# Patient Record
Sex: Male | Born: 1935 | Race: White | Hispanic: No
Health system: Southern US, Community
[De-identification: ages and names within clinical notes are randomized; demographics above are authoritative.]

---

## 2006-02-08 ENCOUNTER — Ambulatory Visit: Payer: Self-pay | Admitting: Surgery

## 2006-02-14 ENCOUNTER — Ambulatory Visit: Payer: Self-pay | Admitting: Surgery

## 2006-08-08 ENCOUNTER — Ambulatory Visit: Payer: Self-pay | Admitting: Ophthalmology

## 2006-08-12 ENCOUNTER — Ambulatory Visit: Payer: Self-pay | Admitting: Ophthalmology

## 2006-11-14 ENCOUNTER — Inpatient Hospital Stay: Payer: Self-pay | Admitting: Vascular Surgery

## 2006-11-14 ENCOUNTER — Other Ambulatory Visit: Payer: Self-pay

## 2006-11-22 ENCOUNTER — Ambulatory Visit: Payer: Self-pay | Admitting: Surgery

## 2006-11-28 ENCOUNTER — Ambulatory Visit: Payer: Self-pay | Admitting: Surgery

## 2007-03-19 ENCOUNTER — Ambulatory Visit: Payer: Self-pay | Admitting: Surgery

## 2007-04-23 ENCOUNTER — Ambulatory Visit: Payer: Self-pay | Admitting: Unknown Physician Specialty

## 2007-04-24 ENCOUNTER — Ambulatory Visit: Payer: Self-pay | Admitting: Unknown Physician Specialty

## 2007-05-15 ENCOUNTER — Ambulatory Visit: Payer: Self-pay | Admitting: Unknown Physician Specialty

## 2007-06-08 ENCOUNTER — Ambulatory Visit: Payer: Self-pay | Admitting: Oncology

## 2007-07-01 ENCOUNTER — Ambulatory Visit: Payer: Self-pay | Admitting: Radiation Oncology

## 2007-07-06 ENCOUNTER — Ambulatory Visit: Payer: Self-pay | Admitting: Radiation Oncology

## 2007-07-07 ENCOUNTER — Ambulatory Visit: Payer: Self-pay | Admitting: Radiation Oncology

## 2007-07-09 ENCOUNTER — Ambulatory Visit: Payer: Self-pay | Admitting: Oncology

## 2007-07-15 ENCOUNTER — Ambulatory Visit: Payer: Self-pay | Admitting: Surgery

## 2007-07-29 ENCOUNTER — Other Ambulatory Visit: Payer: Self-pay

## 2007-07-30 ENCOUNTER — Other Ambulatory Visit: Payer: Self-pay

## 2007-07-30 ENCOUNTER — Inpatient Hospital Stay: Payer: Self-pay | Admitting: Internal Medicine

## 2007-08-06 ENCOUNTER — Ambulatory Visit: Payer: Self-pay | Admitting: Radiation Oncology

## 2007-09-05 ENCOUNTER — Ambulatory Visit: Payer: Self-pay | Admitting: Radiation Oncology

## 2007-10-06 ENCOUNTER — Ambulatory Visit: Payer: Self-pay | Admitting: Radiation Oncology

## 2007-11-05 ENCOUNTER — Ambulatory Visit: Payer: Self-pay | Admitting: Radiation Oncology

## 2007-11-19 ENCOUNTER — Ambulatory Visit: Payer: Self-pay | Admitting: Radiation Oncology

## 2007-12-05 ENCOUNTER — Ambulatory Visit: Payer: Self-pay | Admitting: Oncology

## 2007-12-06 ENCOUNTER — Ambulatory Visit: Payer: Self-pay | Admitting: Radiation Oncology

## 2007-12-10 ENCOUNTER — Ambulatory Visit: Payer: Self-pay | Admitting: Unknown Physician Specialty

## 2007-12-17 ENCOUNTER — Ambulatory Visit: Payer: Self-pay | Admitting: Radiation Oncology

## 2008-01-06 ENCOUNTER — Ambulatory Visit: Payer: Self-pay | Admitting: Radiation Oncology

## 2008-02-05 ENCOUNTER — Ambulatory Visit: Payer: Self-pay | Admitting: Radiation Oncology

## 2008-03-07 ENCOUNTER — Ambulatory Visit: Payer: Self-pay | Admitting: Oncology

## 2008-03-11 ENCOUNTER — Ambulatory Visit: Payer: Self-pay | Admitting: Oncology

## 2008-04-05 ENCOUNTER — Ambulatory Visit: Payer: Self-pay | Admitting: Internal Medicine

## 2008-04-06 ENCOUNTER — Ambulatory Visit: Payer: Self-pay | Admitting: Oncology

## 2008-04-13 ENCOUNTER — Ambulatory Visit: Payer: Self-pay | Admitting: Internal Medicine

## 2008-06-07 ENCOUNTER — Ambulatory Visit: Payer: Self-pay | Admitting: Oncology

## 2008-06-08 ENCOUNTER — Ambulatory Visit: Payer: Self-pay | Admitting: Oncology

## 2008-06-17 ENCOUNTER — Ambulatory Visit: Payer: Self-pay | Admitting: Oncology

## 2008-07-05 ENCOUNTER — Ambulatory Visit: Payer: Self-pay | Admitting: Oncology

## 2008-08-05 ENCOUNTER — Ambulatory Visit: Payer: Self-pay | Admitting: Oncology

## 2008-08-31 ENCOUNTER — Ambulatory Visit: Payer: Self-pay | Admitting: Unknown Physician Specialty

## 2008-09-04 ENCOUNTER — Ambulatory Visit: Payer: Self-pay | Admitting: Oncology

## 2008-09-16 ENCOUNTER — Ambulatory Visit: Payer: Self-pay | Admitting: Oncology

## 2008-10-05 ENCOUNTER — Ambulatory Visit: Payer: Self-pay | Admitting: Oncology

## 2008-11-11 ENCOUNTER — Ambulatory Visit: Payer: Self-pay | Admitting: Ophthalmology

## 2008-11-11 ENCOUNTER — Ambulatory Visit: Payer: Self-pay | Admitting: Cardiovascular Disease

## 2008-11-23 ENCOUNTER — Ambulatory Visit: Payer: Self-pay | Admitting: Ophthalmology

## 2008-12-05 ENCOUNTER — Ambulatory Visit: Payer: Self-pay | Admitting: Oncology

## 2008-12-16 ENCOUNTER — Ambulatory Visit: Payer: Self-pay | Admitting: Oncology

## 2009-01-05 ENCOUNTER — Ambulatory Visit: Payer: Self-pay | Admitting: Oncology

## 2009-02-04 ENCOUNTER — Ambulatory Visit: Payer: Self-pay | Admitting: Oncology

## 2009-06-07 ENCOUNTER — Ambulatory Visit: Payer: Self-pay | Admitting: Oncology

## 2009-06-16 ENCOUNTER — Ambulatory Visit: Payer: Self-pay | Admitting: Oncology

## 2009-07-05 ENCOUNTER — Ambulatory Visit: Payer: Self-pay | Admitting: Oncology

## 2009-09-04 ENCOUNTER — Ambulatory Visit: Payer: Self-pay | Admitting: Oncology

## 2009-09-19 ENCOUNTER — Ambulatory Visit: Payer: Self-pay | Admitting: Unknown Physician Specialty

## 2009-09-21 ENCOUNTER — Ambulatory Visit: Payer: Self-pay | Admitting: Oncology

## 2009-09-23 ENCOUNTER — Ambulatory Visit: Payer: Self-pay | Admitting: Oncology

## 2009-10-05 ENCOUNTER — Ambulatory Visit: Payer: Self-pay | Admitting: Oncology

## 2009-11-04 ENCOUNTER — Ambulatory Visit: Payer: Self-pay | Admitting: Oncology

## 2009-12-05 ENCOUNTER — Ambulatory Visit: Payer: Self-pay | Admitting: Oncology

## 2009-12-15 ENCOUNTER — Ambulatory Visit: Payer: Self-pay | Admitting: Oncology

## 2009-12-23 ENCOUNTER — Ambulatory Visit: Payer: Self-pay | Admitting: Oncology

## 2010-01-05 ENCOUNTER — Ambulatory Visit: Payer: Self-pay | Admitting: Oncology

## 2010-02-20 ENCOUNTER — Ambulatory Visit: Payer: Self-pay | Admitting: Unknown Physician Specialty

## 2010-02-21 LAB — PATHOLOGY REPORT

## 2010-03-06 ENCOUNTER — Ambulatory Visit: Payer: Self-pay | Admitting: Oncology

## 2010-03-07 ENCOUNTER — Ambulatory Visit: Payer: Self-pay | Admitting: Oncology

## 2010-03-09 ENCOUNTER — Ambulatory Visit: Payer: Self-pay | Admitting: Oncology

## 2010-03-10 ENCOUNTER — Ambulatory Visit: Payer: Self-pay | Admitting: Oncology

## 2010-04-06 ENCOUNTER — Ambulatory Visit: Payer: Self-pay | Admitting: Oncology

## 2010-05-07 ENCOUNTER — Ambulatory Visit: Payer: Self-pay | Admitting: Oncology

## 2010-06-07 ENCOUNTER — Ambulatory Visit: Payer: Self-pay | Admitting: Oncology

## 2010-06-09 ENCOUNTER — Inpatient Hospital Stay: Payer: Self-pay | Admitting: Internal Medicine

## 2010-06-14 ENCOUNTER — Encounter: Payer: Self-pay | Admitting: Internal Medicine

## 2010-06-14 DIAGNOSIS — K639 Disease of intestine, unspecified: Secondary | ICD-10-CM

## 2010-06-15 DIAGNOSIS — J449 Chronic obstructive pulmonary disease, unspecified: Secondary | ICD-10-CM

## 2010-06-15 DIAGNOSIS — I2510 Atherosclerotic heart disease of native coronary artery without angina pectoris: Secondary | ICD-10-CM

## 2010-06-15 DIAGNOSIS — E119 Type 2 diabetes mellitus without complications: Secondary | ICD-10-CM

## 2010-06-15 DIAGNOSIS — J4489 Other specified chronic obstructive pulmonary disease: Secondary | ICD-10-CM

## 2010-06-15 DIAGNOSIS — C159 Malignant neoplasm of esophagus, unspecified: Secondary | ICD-10-CM

## 2010-06-22 NOTE — Letter (Signed)
Summary: [facility] Face Sheet  [facility] Face Sheet   Imported By: Beau Fanny 06/15/2010 15:00:32  _____________________________________________________________________  External Attachment:    Type:   Image     Comment:   External Document

## 2010-06-28 DIAGNOSIS — I509 Heart failure, unspecified: Secondary | ICD-10-CM

## 2010-06-28 DIAGNOSIS — F4321 Adjustment disorder with depressed mood: Secondary | ICD-10-CM

## 2010-07-05 ENCOUNTER — Ambulatory Visit: Payer: Self-pay | Admitting: Oncology

## 2010-07-06 DIAGNOSIS — I2510 Atherosclerotic heart disease of native coronary artery without angina pectoris: Secondary | ICD-10-CM

## 2010-07-06 DIAGNOSIS — I10 Essential (primary) hypertension: Secondary | ICD-10-CM

## 2010-07-06 DIAGNOSIS — E119 Type 2 diabetes mellitus without complications: Secondary | ICD-10-CM

## 2010-07-06 DIAGNOSIS — J449 Chronic obstructive pulmonary disease, unspecified: Secondary | ICD-10-CM

## 2010-08-04 ENCOUNTER — Ambulatory Visit: Payer: Self-pay | Admitting: Physician Assistant

## 2010-09-11 ENCOUNTER — Inpatient Hospital Stay: Payer: Self-pay | Admitting: Internal Medicine

## 2010-09-25 ENCOUNTER — Ambulatory Visit: Payer: Self-pay | Admitting: Internal Medicine

## 2010-11-15 ENCOUNTER — Ambulatory Visit: Payer: Self-pay | Admitting: Internal Medicine

## 2010-11-28 ENCOUNTER — Ambulatory Visit: Payer: Self-pay | Admitting: Oncology

## 2010-12-06 ENCOUNTER — Ambulatory Visit: Payer: Self-pay | Admitting: Oncology

## 2010-12-08 ENCOUNTER — Ambulatory Visit: Payer: Self-pay | Admitting: Unknown Physician Specialty

## 2010-12-11 LAB — PATHOLOGY REPORT

## 2010-12-17 ENCOUNTER — Observation Stay: Payer: Self-pay | Admitting: Internal Medicine

## 2010-12-19 ENCOUNTER — Encounter: Payer: Self-pay | Admitting: Internal Medicine

## 2011-01-06 ENCOUNTER — Ambulatory Visit: Payer: Self-pay | Admitting: Oncology

## 2011-01-06 ENCOUNTER — Encounter: Payer: Self-pay | Admitting: Internal Medicine

## 2011-01-18 ENCOUNTER — Ambulatory Visit: Payer: Self-pay | Admitting: Surgery

## 2011-02-05 ENCOUNTER — Ambulatory Visit: Payer: Self-pay | Admitting: Oncology

## 2011-02-05 ENCOUNTER — Encounter: Payer: Self-pay | Admitting: Internal Medicine

## 2011-03-08 ENCOUNTER — Ambulatory Visit: Payer: Self-pay | Admitting: Oncology

## 2011-03-08 ENCOUNTER — Encounter: Payer: Self-pay | Admitting: Internal Medicine

## 2011-04-07 ENCOUNTER — Ambulatory Visit: Payer: Self-pay | Admitting: Oncology

## 2011-04-07 ENCOUNTER — Encounter: Payer: Self-pay | Admitting: Internal Medicine

## 2011-04-25 ENCOUNTER — Ambulatory Visit: Payer: Self-pay | Admitting: Oncology

## 2011-05-08 ENCOUNTER — Ambulatory Visit: Payer: Self-pay | Admitting: Oncology

## 2011-05-08 ENCOUNTER — Encounter: Payer: Self-pay | Admitting: Internal Medicine

## 2011-06-08 ENCOUNTER — Ambulatory Visit: Payer: Self-pay | Admitting: Oncology

## 2011-06-08 ENCOUNTER — Encounter: Payer: Self-pay | Admitting: Internal Medicine

## 2011-06-25 ENCOUNTER — Emergency Department: Payer: Self-pay | Admitting: Emergency Medicine

## 2011-07-06 ENCOUNTER — Ambulatory Visit: Payer: Self-pay | Admitting: Oncology

## 2011-07-19 ENCOUNTER — Ambulatory Visit: Payer: Self-pay | Admitting: Oncology

## 2011-07-20 LAB — CBC CANCER CENTER
Basophil %: 0 %
Eosinophil #: 0.3 x10 3/mm (ref 0.0–0.7)
Eosinophil %: 3.9 %
Lymphocyte #: 1.7 x10 3/mm (ref 1.0–3.6)
MCH: 20.8 pg — ABNORMAL LOW (ref 26.0–34.0)
MCHC: 30.7 g/dL — ABNORMAL LOW (ref 32.0–36.0)
Monocyte #: 0.9 x10 3/mm — ABNORMAL HIGH (ref 0.0–0.7)
Neutrophil %: 64.3 %
RBC: 3.88 10*6/uL — ABNORMAL LOW (ref 4.40–5.90)
RDW: 22.7 % — ABNORMAL HIGH (ref 11.5–14.5)
WBC: 8.4 x10 3/mm (ref 3.8–10.6)

## 2011-07-20 LAB — COMPREHENSIVE METABOLIC PANEL
Albumin: 2.9 g/dL — ABNORMAL LOW (ref 3.4–5.0)
Anion Gap: 7 (ref 7–16)
BUN: 28 mg/dL — ABNORMAL HIGH (ref 7–18)
Bilirubin,Total: 0.3 mg/dL (ref 0.2–1.0)
Calcium, Total: 8.5 mg/dL (ref 8.5–10.1)
Chloride: 111 mmol/L — ABNORMAL HIGH (ref 98–107)
EGFR (African American): 47 — ABNORMAL LOW
Glucose: 118 mg/dL — ABNORMAL HIGH (ref 65–99)
SGOT(AST): 15 U/L (ref 15–37)
SGPT (ALT): 18 U/L
Sodium: 144 mmol/L (ref 136–145)
Total Protein: 6.7 g/dL (ref 6.4–8.2)

## 2011-08-06 ENCOUNTER — Ambulatory Visit: Payer: Self-pay | Admitting: Oncology

## 2011-08-17 ENCOUNTER — Ambulatory Visit: Payer: Self-pay | Admitting: Unknown Physician Specialty

## 2011-08-20 LAB — PATHOLOGY REPORT

## 2011-09-20 ENCOUNTER — Inpatient Hospital Stay: Payer: Self-pay | Admitting: Internal Medicine

## 2011-09-20 ENCOUNTER — Ambulatory Visit: Payer: Self-pay | Admitting: Oncology

## 2011-09-20 LAB — COMPREHENSIVE METABOLIC PANEL
Albumin: 3 g/dL — ABNORMAL LOW (ref 3.4–5.0)
Alkaline Phosphatase: 94 U/L (ref 50–136)
Alkaline Phosphatase: 94 U/L (ref 50–136)
Anion Gap: 9 (ref 7–16)
Anion Gap: 5 — ABNORMAL LOW (ref 7–16)
BUN: 13 mg/dL (ref 7–18)
Bilirubin,Total: 0.5 mg/dL (ref 0.2–1.0)
Calcium, Total: 8.4 mg/dL — ABNORMAL LOW (ref 8.5–10.1)
Calcium, Total: 8.4 mg/dL — ABNORMAL LOW (ref 8.5–10.1)
Chloride: 108 mmol/L — ABNORMAL HIGH (ref 98–107)
Co2: 28 mmol/L (ref 21–32)
Creatinine: 1.38 mg/dL — ABNORMAL HIGH (ref 0.60–1.30)
Creatinine: 1.3 mg/dL (ref 0.60–1.30)
EGFR (African American): 54 — ABNORMAL LOW
EGFR (African American): 58 — ABNORMAL LOW
EGFR (Non-African Amer.): 47 — ABNORMAL LOW
EGFR (Non-African Amer.): 50 — ABNORMAL LOW
Glucose: 124 mg/dL — ABNORMAL HIGH (ref 65–99)
Glucose: 155 mg/dL — ABNORMAL HIGH (ref 65–99)
Osmolality: 289 (ref 275–301)
Osmolality: 285 (ref 275–301)
Potassium: 4.2 mmol/L (ref 3.5–5.1)
SGOT(AST): 19 U/L (ref 15–37)
SGOT(AST): 25 U/L (ref 15–37)
SGPT (ALT): 19 U/L
SGPT (ALT): 17 U/L
Sodium: 144 mmol/L (ref 136–145)
Sodium: 141 mmol/L (ref 136–145)
Total Protein: 6.7 g/dL (ref 6.4–8.2)
Total Protein: 7 g/dL (ref 6.4–8.2)

## 2011-09-20 LAB — CBC CANCER CENTER
Eosinophil #: 0.5 x10 3/mm (ref 0.0–0.7)
Eosinophil %: 5.4 %
HCT: 38.1 % — ABNORMAL LOW (ref 40.0–52.0)
HGB: 11.9 g/dL — ABNORMAL LOW (ref 13.0–18.0)
Lymphocyte %: 14.5 %
MCH: 25.3 pg — ABNORMAL LOW (ref 26.0–34.0)
MCV: 81 fL (ref 80–100)
Monocyte #: 0.9 x10 3/mm (ref 0.2–1.0)
Platelet: 183 x10 3/mm (ref 150–440)
RBC: 4.7 10*6/uL (ref 4.40–5.90)

## 2011-09-20 LAB — CBC
HCT: 37.9 % — ABNORMAL LOW (ref 40.0–52.0)
HGB: 12.1 g/dL — ABNORMAL LOW (ref 13.0–18.0)
MCH: 25.6 pg — ABNORMAL LOW (ref 26.0–34.0)
MCHC: 31.8 g/dL — ABNORMAL LOW (ref 32.0–36.0)
MCV: 81 fL (ref 80–100)
Platelet: 184 10*3/uL (ref 150–440)
RBC: 4.71 10*6/uL (ref 4.40–5.90)
RDW: 30.1 % — ABNORMAL HIGH (ref 11.5–14.5)
WBC: 8.1 10*3/uL (ref 3.8–10.6)

## 2011-09-20 LAB — PROTIME-INR
INR: 0.9
Prothrombin Time: 12.5 secs (ref 11.5–14.7)

## 2011-09-20 LAB — URINALYSIS, COMPLETE
Bilirubin,UR: NEGATIVE
Blood: NEGATIVE
Glucose,UR: NEGATIVE mg/dL (ref 0–75)
Ketone: NEGATIVE
Nitrite: NEGATIVE
Ph: 6 (ref 4.5–8.0)
Protein: NEGATIVE
RBC,UR: 1 /HPF (ref 0–5)
Specific Gravity: 1.006 (ref 1.003–1.030)
Squamous Epithelial: NONE SEEN
WBC UR: 4 /HPF (ref 0–5)

## 2011-09-21 LAB — CBC WITH DIFFERENTIAL/PLATELET
Basophil #: 0 10*3/uL (ref 0.0–0.1)
Eosinophil #: 0 10*3/uL (ref 0.0–0.7)
Lymphocyte %: 5.7 %
MCH: 25.1 pg — ABNORMAL LOW (ref 26.0–34.0)
MCHC: 31.4 g/dL — ABNORMAL LOW (ref 32.0–36.0)
MCV: 80 fL (ref 80–100)
Neutrophil #: 10.8 10*3/uL — ABNORMAL HIGH (ref 1.4–6.5)
Neutrophil %: 90.8 %
Platelet: 164 10*3/uL (ref 150–440)
RBC: 4.46 10*6/uL (ref 4.40–5.90)
RDW: 29.4 % — ABNORMAL HIGH (ref 11.5–14.5)
WBC: 11.9 10*3/uL — ABNORMAL HIGH (ref 3.8–10.6)

## 2011-09-21 LAB — BASIC METABOLIC PANEL
Anion Gap: 8 (ref 7–16)
BUN: 17 mg/dL (ref 7–18)
Chloride: 110 mmol/L — ABNORMAL HIGH (ref 98–107)
Co2: 26 mmol/L (ref 21–32)
Creatinine: 1.26 mg/dL (ref 0.60–1.30)
EGFR (Non-African Amer.): 52 — ABNORMAL LOW
Glucose: 171 mg/dL — ABNORMAL HIGH (ref 65–99)
Osmolality: 292 (ref 275–301)
Sodium: 144 mmol/L (ref 136–145)

## 2011-09-22 LAB — BASIC METABOLIC PANEL
BUN: 20 mg/dL — ABNORMAL HIGH (ref 7–18)
Calcium, Total: 7.8 mg/dL — ABNORMAL LOW (ref 8.5–10.1)
Co2: 26 mmol/L (ref 21–32)
Creatinine: 1.27 mg/dL (ref 0.60–1.30)
EGFR (African American): >60
EGFR (Non-African Amer.): 52 — ABNORMAL LOW
Glucose: 113 mg/dL — ABNORMAL HIGH (ref 65–99)
Osmolality: 288 (ref 275–301)
Potassium: 4.2 mmol/L (ref 3.5–5.1)
Sodium: 143 mmol/L (ref 136–145)

## 2011-09-22 LAB — CBC WITH DIFFERENTIAL/PLATELET
Basophil %: 0.1 %
Eosinophil #: 0 10*3/uL (ref 0.0–0.7)
Eosinophil %: 0 %
HCT: 32 % — ABNORMAL LOW (ref 40.0–52.0)
HGB: 10.1 g/dL — ABNORMAL LOW (ref 13.0–18.0)
Lymphocyte #: 0.9 10*3/uL — ABNORMAL LOW (ref 1.0–3.6)
MCH: 25.3 pg — ABNORMAL LOW (ref 26.0–34.0)
Monocyte #: 0.5 x10 3/mm (ref 0.2–1.0)
Monocyte %: 3.8 %
Neutrophil %: 89.9 %
RDW: 29.4 % — ABNORMAL HIGH (ref 11.5–14.5)
WBC: 13.8 10*3/uL — ABNORMAL HIGH (ref 3.8–10.6)

## 2011-09-23 LAB — BASIC METABOLIC PANEL
Anion Gap: 7 (ref 7–16)
BUN: 18 mg/dL (ref 7–18)
Calcium, Total: 8 mg/dL — ABNORMAL LOW (ref 8.5–10.1)
Creatinine: 1.22 mg/dL (ref 0.60–1.30)
EGFR (Non-African Amer.): 54 — ABNORMAL LOW
Osmolality: 289 (ref 275–301)
Potassium: 4.3 mmol/L (ref 3.5–5.1)
Sodium: 144 mmol/L (ref 136–145)

## 2011-09-23 LAB — CBC WITH DIFFERENTIAL/PLATELET
Eosinophil #: 0.1 10*3/uL (ref 0.0–0.7)
Lymphocyte #: 1.2 10*3/uL (ref 1.0–3.6)
Lymphocyte %: 14.7 %
MCH: 25.5 pg — ABNORMAL LOW (ref 26.0–34.0)
MCV: 80 fL (ref 80–100)
Monocyte #: 0.3 x10 3/mm (ref 0.2–1.0)
RBC: 3.94 10*6/uL — ABNORMAL LOW (ref 4.40–5.90)
RDW: 29.3 % — ABNORMAL HIGH (ref 11.5–14.5)

## 2011-09-24 ENCOUNTER — Encounter: Payer: Self-pay | Admitting: Internal Medicine

## 2011-10-04 LAB — BASIC METABOLIC PANEL
Anion Gap: 9 (ref 7–16)
BUN: 27 mg/dL — ABNORMAL HIGH (ref 7–18)
Calcium, Total: 8.8 mg/dL (ref 8.5–10.1)
Chloride: 109 mmol/L — ABNORMAL HIGH (ref 98–107)
Co2: 25 mmol/L (ref 21–32)
EGFR (African American): 47 — ABNORMAL LOW
EGFR (Non-African Amer.): 40 — ABNORMAL LOW
Glucose: 106 mg/dL — ABNORMAL HIGH (ref 65–99)

## 2011-10-04 LAB — CBC WITH DIFFERENTIAL/PLATELET
Basophil %: 1.7 %
Eosinophil #: 0.5 10*3/uL (ref 0.0–0.7)
HGB: 10.1 g/dL — ABNORMAL LOW (ref 13.0–18.0)
Lymphocyte #: 1.7 10*3/uL (ref 1.0–3.6)
MCHC: 30.9 g/dL — ABNORMAL LOW (ref 32.0–36.0)
Monocyte #: 1.2 x10 3/mm — ABNORMAL HIGH (ref 0.2–1.0)
Monocyte %: 13.9 %
Neutrophil #: 4.8 10*3/uL (ref 1.4–6.5)
Platelet: 335 10*3/uL (ref 150–440)
RDW: 28.3 % — ABNORMAL HIGH (ref 11.5–14.5)
WBC: 8.3 10*3/uL (ref 3.8–10.6)

## 2011-10-06 ENCOUNTER — Ambulatory Visit: Payer: Self-pay | Admitting: Oncology

## 2011-10-06 ENCOUNTER — Encounter: Payer: Self-pay | Admitting: Internal Medicine

## 2011-10-18 LAB — COMPREHENSIVE METABOLIC PANEL
Albumin: 2.6 g/dL — ABNORMAL LOW (ref 3.4–5.0)
Anion Gap: 11 (ref 7–16)
BUN: 40 mg/dL — ABNORMAL HIGH (ref 7–18)
Bilirubin,Total: 0.4 mg/dL (ref 0.2–1.0)
Calcium, Total: 8.3 mg/dL — ABNORMAL LOW (ref 8.5–10.1)
Chloride: 104 mmol/L (ref 98–107)
Co2: 24 mmol/L (ref 21–32)
Creatinine: 1.69 mg/dL — ABNORMAL HIGH (ref 0.60–1.30)
EGFR (African American): 43 — ABNORMAL LOW
EGFR (Non-African Amer.): 37 — ABNORMAL LOW
Glucose: 211 mg/dL — ABNORMAL HIGH (ref 65–99)
Osmolality: 294 (ref 275–301)
Potassium: 4.2 mmol/L (ref 3.5–5.1)
SGOT(AST): 19 U/L (ref 15–37)
SGPT (ALT): 44 U/L

## 2011-10-18 LAB — CBC CANCER CENTER
Eosinophil #: 0.3 x10 3/mm (ref 0.0–0.7)
HGB: 11.1 g/dL — ABNORMAL LOW (ref 13.0–18.0)
Lymphocyte #: 1.4 x10 3/mm (ref 1.0–3.6)
Lymphocyte %: 11.4 %
MCH: 26.7 pg (ref 26.0–34.0)
MCV: 87 fL (ref 80–100)
Neutrophil %: 73.8 %
RDW: 25.7 % — ABNORMAL HIGH (ref 11.5–14.5)
WBC: 12.2 x10 3/mm — ABNORMAL HIGH (ref 3.8–10.6)

## 2011-10-18 LAB — FERRITIN: Ferritin (ARMC): 55 ng/mL (ref 8–388)

## 2011-10-18 LAB — IRON AND TIBC
Iron: 63 ug/dL — ABNORMAL LOW (ref 65–175)
Unbound Iron-Bind.Cap.: 232 ug/dL

## 2011-10-25 LAB — CBC CANCER CENTER
Basophil #: 0.1 x10 3/mm (ref 0.0–0.1)
Eosinophil #: 0.3 x10 3/mm (ref 0.0–0.7)
HCT: 36.7 % — ABNORMAL LOW (ref 40.0–52.0)
HGB: 11.4 g/dL — ABNORMAL LOW (ref 13.0–18.0)
Lymphocyte #: 0.9 x10 3/mm — ABNORMAL LOW (ref 1.0–3.6)
Lymphocyte %: 10.8 %
MCH: 27.1 pg (ref 26.0–34.0)
MCHC: 31.1 g/dL — ABNORMAL LOW (ref 32.0–36.0)
MCV: 87 fL (ref 80–100)
Monocyte %: 6.1 %
Neutrophil #: 6.8 x10 3/mm — ABNORMAL HIGH (ref 1.4–6.5)
Neutrophil %: 78 %
RBC: 4.2 10*6/uL — ABNORMAL LOW (ref 4.40–5.90)
RDW: 22.8 % — ABNORMAL HIGH (ref 11.5–14.5)
WBC: 8.7 x10 3/mm (ref 3.8–10.6)

## 2011-10-25 LAB — COMPREHENSIVE METABOLIC PANEL
Alkaline Phosphatase: 123 U/L (ref 50–136)
Anion Gap: 9 (ref 7–16)
Calcium, Total: 8.6 mg/dL (ref 8.5–10.1)
Co2: 25 mmol/L (ref 21–32)
EGFR (African American): 48 — ABNORMAL LOW
Glucose: 161 mg/dL — ABNORMAL HIGH (ref 65–99)
Osmolality: 286 (ref 275–301)
Potassium: 4.4 mmol/L (ref 3.5–5.1)
Sodium: 139 mmol/L (ref 136–145)

## 2011-11-01 LAB — CBC CANCER CENTER
Basophil #: 0.1 x10 3/mm (ref 0.0–0.1)
Basophil %: 2 %
Eosinophil #: 0.2 x10 3/mm (ref 0.0–0.7)
Eosinophil %: 4.9 %
HGB: 10.4 g/dL — ABNORMAL LOW (ref 13.0–18.0)
Lymphocyte %: 19.8 %
MCH: 27.8 pg (ref 26.0–34.0)
MCHC: 31.5 g/dL — ABNORMAL LOW (ref 32.0–36.0)
MCV: 88 fL (ref 80–100)
Monocyte #: 0.5 x10 3/mm (ref 0.2–1.0)
Monocyte %: 9.7 %
Neutrophil #: 3.1 x10 3/mm (ref 1.4–6.5)
Neutrophil %: 63.6 %
Platelet: 220 x10 3/mm (ref 150–440)

## 2011-11-01 LAB — COMPREHENSIVE METABOLIC PANEL
Albumin: 2.6 g/dL — ABNORMAL LOW (ref 3.4–5.0)
Alkaline Phosphatase: 109 U/L (ref 50–136)
BUN: 30 mg/dL — ABNORMAL HIGH (ref 7–18)
Chloride: 109 mmol/L — ABNORMAL HIGH (ref 98–107)
Co2: 24 mmol/L (ref 21–32)
Creatinine: 1.63 mg/dL — ABNORMAL HIGH (ref 0.60–1.30)
Glucose: 180 mg/dL — ABNORMAL HIGH (ref 65–99)
SGPT (ALT): 26 U/L

## 2011-11-05 ENCOUNTER — Ambulatory Visit: Payer: Self-pay | Admitting: Oncology

## 2011-11-15 LAB — COMPREHENSIVE METABOLIC PANEL
Alkaline Phosphatase: 104 U/L (ref 50–136)
Anion Gap: 9 (ref 7–16)
Bilirubin,Total: 0.3 mg/dL (ref 0.2–1.0)
Calcium, Total: 8.5 mg/dL (ref 8.5–10.1)
Chloride: 107 mmol/L (ref 98–107)
Co2: 26 mmol/L (ref 21–32)
Creatinine: 1.69 mg/dL — ABNORMAL HIGH (ref 0.60–1.30)
EGFR (African American): 43 — ABNORMAL LOW
EGFR (Non-African Amer.): 37 — ABNORMAL LOW
Glucose: 156 mg/dL — ABNORMAL HIGH (ref 65–99)
Potassium: 4.2 mmol/L (ref 3.5–5.1)
SGOT(AST): 15 U/L (ref 15–37)
SGPT (ALT): 19 U/L
Sodium: 142 mmol/L (ref 136–145)

## 2011-11-15 LAB — CBC CANCER CENTER
Basophil %: 2.5 %
Eosinophil #: 0.2 x10 3/mm (ref 0.0–0.7)
HGB: 10.5 g/dL — ABNORMAL LOW (ref 13.0–18.0)
Lymphocyte #: 1.2 x10 3/mm (ref 1.0–3.6)
Lymphocyte %: 16.6 %
MCH: 27.6 pg (ref 26.0–34.0)
MCHC: 31.5 g/dL — ABNORMAL LOW (ref 32.0–36.0)
MCV: 87 fL (ref 80–100)
Monocyte #: 1.2 x10 3/mm — ABNORMAL HIGH (ref 0.2–1.0)
Neutrophil %: 60.8 %
Platelet: 287 x10 3/mm (ref 150–440)
RBC: 3.81 10*6/uL — ABNORMAL LOW (ref 4.40–5.90)
RDW: 20.3 % — ABNORMAL HIGH (ref 11.5–14.5)

## 2011-11-22 LAB — CBC CANCER CENTER
Basophil #: 0.1 x10 3/mm (ref 0.0–0.1)
Eosinophil %: 2.1 %
Lymphocyte #: 1.1 x10 3/mm (ref 1.0–3.6)
Lymphocyte %: 13.2 %
Monocyte %: 5.4 %
Neutrophil #: 6.7 x10 3/mm — ABNORMAL HIGH (ref 1.4–6.5)
Neutrophil %: 78 %
Platelet: 227 x10 3/mm (ref 150–440)
RBC: 3.88 10*6/uL — ABNORMAL LOW (ref 4.40–5.90)
RDW: 20.6 % — ABNORMAL HIGH (ref 11.5–14.5)
WBC: 8.5 x10 3/mm (ref 3.8–10.6)

## 2011-11-22 LAB — COMPREHENSIVE METABOLIC PANEL
Alkaline Phosphatase: 93 U/L (ref 50–136)
Anion Gap: 8 (ref 7–16)
BUN: 16 mg/dL (ref 7–18)
Bilirubin,Total: 0.4 mg/dL (ref 0.2–1.0)
Chloride: 107 mmol/L (ref 98–107)
Co2: 26 mmol/L (ref 21–32)
Creatinine: 1.32 mg/dL — ABNORMAL HIGH (ref 0.60–1.30)
EGFR (African American): 57 — ABNORMAL LOW
EGFR (Non-African Amer.): 50 — ABNORMAL LOW
Glucose: 154 mg/dL — ABNORMAL HIGH (ref 65–99)
Osmolality: 286 (ref 275–301)
Potassium: 4.2 mmol/L (ref 3.5–5.1)
Sodium: 141 mmol/L (ref 136–145)

## 2011-11-29 LAB — COMPREHENSIVE METABOLIC PANEL
Albumin: 2.7 g/dL — ABNORMAL LOW (ref 3.4–5.0)
Anion Gap: 7 (ref 7–16)
BUN: 19 mg/dL — ABNORMAL HIGH (ref 7–18)
Calcium, Total: 8.2 mg/dL — ABNORMAL LOW (ref 8.5–10.1)
Chloride: 108 mmol/L — ABNORMAL HIGH (ref 98–107)
Co2: 26 mmol/L (ref 21–32)
EGFR (African American): 53 — ABNORMAL LOW
Glucose: 127 mg/dL — ABNORMAL HIGH (ref 65–99)
Osmolality: 285 (ref 275–301)
Potassium: 4.3 mmol/L (ref 3.5–5.1)
SGPT (ALT): 21 U/L
Sodium: 141 mmol/L (ref 136–145)
Total Protein: 5.8 g/dL — ABNORMAL LOW (ref 6.4–8.2)

## 2011-11-29 LAB — CBC CANCER CENTER
Basophil #: 0.1 x10 3/mm (ref 0.0–0.1)
Basophil %: 1.3 %
Eosinophil #: 0.2 x10 3/mm (ref 0.0–0.7)
HCT: 32.9 % — ABNORMAL LOW (ref 40.0–52.0)
HGB: 10.6 g/dL — ABNORMAL LOW (ref 13.0–18.0)
Lymphocyte #: 1.1 x10 3/mm (ref 1.0–3.6)
Lymphocyte %: 19.6 %
MCHC: 32.4 g/dL (ref 32.0–36.0)
MCV: 89 fL (ref 80–100)
Neutrophil #: 3.8 x10 3/mm (ref 1.4–6.5)
Platelet: 203 x10 3/mm (ref 150–440)
RBC: 3.71 10*6/uL — ABNORMAL LOW (ref 4.40–5.90)
RDW: 21.4 % — ABNORMAL HIGH (ref 11.5–14.5)
WBC: 5.6 x10 3/mm (ref 3.8–10.6)

## 2011-12-04 ENCOUNTER — Encounter: Payer: Self-pay | Admitting: Orthopedic Surgery

## 2011-12-06 ENCOUNTER — Ambulatory Visit: Payer: Self-pay | Admitting: Oncology

## 2011-12-06 ENCOUNTER — Encounter: Payer: Self-pay | Admitting: Orthopedic Surgery

## 2011-12-13 LAB — CBC CANCER CENTER
Basophil %: 2.2 %
Eosinophil %: 2.7 %
HCT: 32.9 % — ABNORMAL LOW (ref 40.0–52.0)
HGB: 10.7 g/dL — ABNORMAL LOW (ref 13.0–18.0)
Lymphocyte %: 16.8 %
MCH: 28.5 pg (ref 26.0–34.0)
MCV: 87 fL (ref 80–100)
Monocyte %: 14.9 %
Neutrophil #: 4.6 x10 3/mm (ref 1.4–6.5)
Platelet: 234 x10 3/mm (ref 150–440)
RBC: 3.76 10*6/uL — ABNORMAL LOW (ref 4.40–5.90)
WBC: 7.3 x10 3/mm (ref 3.8–10.6)

## 2011-12-13 LAB — COMPREHENSIVE METABOLIC PANEL
Albumin: 2.7 g/dL — ABNORMAL LOW (ref 3.4–5.0)
Alkaline Phosphatase: 92 U/L (ref 50–136)
BUN: 23 mg/dL — ABNORMAL HIGH (ref 7–18)
Bilirubin,Total: 0.3 mg/dL (ref 0.2–1.0)
Co2: 27 mmol/L (ref 21–32)
Creatinine: 1.4 mg/dL — ABNORMAL HIGH (ref 0.60–1.30)
EGFR (African American): 53 — ABNORMAL LOW
EGFR (Non-African Amer.): 46 — ABNORMAL LOW
Glucose: 163 mg/dL — ABNORMAL HIGH (ref 65–99)
Osmolality: 292 (ref 275–301)
SGPT (ALT): 16 U/L (ref 12–78)
Sodium: 143 mmol/L (ref 136–145)

## 2011-12-20 LAB — CBC CANCER CENTER
Basophil %: 1.1 %
Eosinophil #: 0.3 x10 3/mm (ref 0.0–0.7)
Eosinophil %: 3 %
HGB: 9.9 g/dL — ABNORMAL LOW (ref 13.0–18.0)
Lymphocyte %: 13.7 %
MCH: 28 pg (ref 26.0–34.0)
MCHC: 31.7 g/dL — ABNORMAL LOW (ref 32.0–36.0)
MCV: 89 fL (ref 80–100)
Monocyte %: 6.5 %
Neutrophil %: 75.7 %
Platelet: 231 x10 3/mm (ref 150–440)
RBC: 3.54 10*6/uL — ABNORMAL LOW (ref 4.40–5.90)
WBC: 10.9 x10 3/mm — ABNORMAL HIGH (ref 3.8–10.6)

## 2011-12-20 LAB — COMPREHENSIVE METABOLIC PANEL
Albumin: 2.6 g/dL — ABNORMAL LOW (ref 3.4–5.0)
Anion Gap: 8 (ref 7–16)
Calcium, Total: 8.2 mg/dL — ABNORMAL LOW (ref 8.5–10.1)
Chloride: 106 mmol/L (ref 98–107)
Co2: 27 mmol/L (ref 21–32)
EGFR (African American): 43 — ABNORMAL LOW
EGFR (Non-African Amer.): 37 — ABNORMAL LOW
Glucose: 159 mg/dL — ABNORMAL HIGH (ref 65–99)
Osmolality: 290 (ref 275–301)
Potassium: 4.9 mmol/L (ref 3.5–5.1)
SGOT(AST): 17 U/L (ref 15–37)
Total Protein: 5.7 g/dL — ABNORMAL LOW (ref 6.4–8.2)

## 2011-12-27 LAB — COMPREHENSIVE METABOLIC PANEL
Albumin: 2.6 g/dL — ABNORMAL LOW (ref 3.4–5.0)
Alkaline Phosphatase: 89 U/L (ref 50–136)
Bilirubin,Total: 0.5 mg/dL (ref 0.2–1.0)
Calcium, Total: 8.1 mg/dL — ABNORMAL LOW (ref 8.5–10.1)
Creatinine: 1.41 mg/dL — ABNORMAL HIGH (ref 0.60–1.30)
Osmolality: 288 (ref 275–301)
Potassium: 4.4 mmol/L (ref 3.5–5.1)
SGOT(AST): 21 U/L (ref 15–37)
SGPT (ALT): 21 U/L (ref 12–78)
Sodium: 142 mmol/L (ref 136–145)

## 2011-12-27 LAB — CBC CANCER CENTER
Basophil %: 1.5 %
Eosinophil #: 0.2 x10 3/mm (ref 0.0–0.7)
HGB: 9.3 g/dL — ABNORMAL LOW (ref 13.0–18.0)
Lymphocyte #: 0.9 x10 3/mm — ABNORMAL LOW (ref 1.0–3.6)
Lymphocyte %: 14.9 %
MCH: 28 pg (ref 26.0–34.0)
MCHC: 31.4 g/dL — ABNORMAL LOW (ref 32.0–36.0)
MCV: 89 fL (ref 80–100)
Neutrophil #: 4.5 x10 3/mm (ref 1.4–6.5)
Neutrophil %: 72.7 %
RDW: 23.8 % — ABNORMAL HIGH (ref 11.5–14.5)

## 2011-12-27 LAB — PROTIME-INR
INR: 0.9
Prothrombin Time: 12.4 secs (ref 11.5–14.7)

## 2011-12-27 LAB — APTT: Activated PTT: 27.9 secs (ref 23.6–35.9)

## 2012-01-06 ENCOUNTER — Ambulatory Visit: Payer: Self-pay | Admitting: Oncology

## 2012-01-09 ENCOUNTER — Ambulatory Visit: Payer: Self-pay | Admitting: Internal Medicine

## 2012-02-09 ENCOUNTER — Emergency Department: Payer: Self-pay | Admitting: Emergency Medicine

## 2012-03-26 ENCOUNTER — Ambulatory Visit: Payer: Self-pay | Admitting: Oncology

## 2012-03-27 ENCOUNTER — Ambulatory Visit: Payer: Self-pay | Admitting: Oncology

## 2012-03-27 LAB — COMPREHENSIVE METABOLIC PANEL
Albumin: 2.8 g/dL — ABNORMAL LOW (ref 3.4–5.0)
Anion Gap: 9 (ref 7–16)
Bilirubin,Total: 0.3 mg/dL (ref 0.2–1.0)
Calcium, Total: 8.7 mg/dL (ref 8.5–10.1)
Chloride: 106 mmol/L (ref 98–107)
Co2: 27 mmol/L (ref 21–32)
Creatinine: 1.44 mg/dL — ABNORMAL HIGH (ref 0.60–1.30)
EGFR (African American): 52 — ABNORMAL LOW
Glucose: 158 mg/dL — ABNORMAL HIGH (ref 65–99)
Osmolality: 289 (ref 275–301)
Potassium: 4 mmol/L (ref 3.5–5.1)
Sodium: 142 mmol/L (ref 136–145)
Total Protein: 6.6 g/dL (ref 6.4–8.2)

## 2012-03-27 LAB — CBC CANCER CENTER
Basophil #: 0.1 x10 3/mm (ref 0.0–0.1)
Basophil %: 1.7 %
Eosinophil #: 0.3 x10 3/mm (ref 0.0–0.7)
HCT: 34.7 % — ABNORMAL LOW (ref 40.0–52.0)
Lymphocyte #: 1.4 x10 3/mm (ref 1.0–3.6)
MCH: 24.5 pg — ABNORMAL LOW (ref 26.0–34.0)
MCV: 81 fL (ref 80–100)
Monocyte #: 1.1 x10 3/mm — ABNORMAL HIGH (ref 0.2–1.0)
Platelet: 249 x10 3/mm (ref 150–440)
RDW: 20.5 % — ABNORMAL HIGH (ref 11.5–14.5)
WBC: 8.8 x10 3/mm (ref 3.8–10.6)

## 2012-04-06 ENCOUNTER — Ambulatory Visit: Payer: Self-pay | Admitting: Oncology

## 2012-07-17 ENCOUNTER — Ambulatory Visit: Payer: Self-pay | Admitting: Unknown Physician Specialty

## 2012-07-18 LAB — PATHOLOGY REPORT

## 2012-07-28 ENCOUNTER — Ambulatory Visit: Payer: Self-pay | Admitting: Oncology

## 2012-07-29 LAB — CBC CANCER CENTER
Basophil %: 1.9 %
Eosinophil #: 0.5 x10 3/mm (ref 0.0–0.7)
Eosinophil %: 5.9 %
HCT: 36.6 % — ABNORMAL LOW (ref 40.0–52.0)
HGB: 11.4 g/dL — ABNORMAL LOW (ref 13.0–18.0)
Lymphocyte #: 1.5 x10 3/mm (ref 1.0–3.6)
MCV: 75 fL — ABNORMAL LOW (ref 80–100)
Neutrophil %: 62.5 %
Platelet: 208 x10 3/mm (ref 150–440)
RBC: 4.91 10*6/uL (ref 4.40–5.90)
RDW: 21.3 % — ABNORMAL HIGH (ref 11.5–14.5)

## 2012-07-29 LAB — COMPREHENSIVE METABOLIC PANEL
Albumin: 2.9 g/dL — ABNORMAL LOW (ref 3.4–5.0)
Anion Gap: 8 (ref 7–16)
BUN: 20 mg/dL — ABNORMAL HIGH (ref 7–18)
Chloride: 103 mmol/L (ref 98–107)
Co2: 30 mmol/L (ref 21–32)
Creatinine: 1.49 mg/dL — ABNORMAL HIGH (ref 0.60–1.30)
EGFR (Non-African Amer.): 43 — ABNORMAL LOW
SGOT(AST): 17 U/L (ref 15–37)
Sodium: 141 mmol/L (ref 136–145)

## 2012-08-04 ENCOUNTER — Ambulatory Visit: Payer: Self-pay | Admitting: Oncology

## 2012-08-05 ENCOUNTER — Ambulatory Visit: Payer: Self-pay | Admitting: Oncology

## 2012-08-05 LAB — COMPREHENSIVE METABOLIC PANEL
Alkaline Phosphatase: 102 U/L (ref 50–136)
Anion Gap: 8 (ref 7–16)
Bilirubin,Total: 0.3 mg/dL (ref 0.2–1.0)
Calcium, Total: 8.3 mg/dL — ABNORMAL LOW (ref 8.5–10.1)
Co2: 29 mmol/L (ref 21–32)
EGFR (African American): 53 — ABNORMAL LOW
EGFR (Non-African Amer.): 46 — ABNORMAL LOW
Osmolality: 292 (ref 275–301)
Potassium: 3.5 mmol/L (ref 3.5–5.1)
SGOT(AST): 17 U/L (ref 15–37)
Sodium: 144 mmol/L (ref 136–145)

## 2012-08-05 LAB — CBC CANCER CENTER
Basophil #: 0.1 x10 3/mm (ref 0.0–0.1)
Basophil %: 1.2 %
Eosinophil #: 0.5 x10 3/mm (ref 0.0–0.7)
Eosinophil %: 5.6 %
HCT: 35 % — ABNORMAL LOW (ref 40.0–52.0)
HGB: 11 g/dL — ABNORMAL LOW (ref 13.0–18.0)
Lymphocyte %: 11.9 %
MCHC: 31.5 g/dL — ABNORMAL LOW (ref 32.0–36.0)
MCV: 75 fL — ABNORMAL LOW (ref 80–100)
Monocyte #: 1.2 x10 3/mm — ABNORMAL HIGH (ref 0.2–1.0)
Neutrophil #: 6.5 x10 3/mm (ref 1.4–6.5)
Neutrophil %: 68.6 %
Platelet: 201 x10 3/mm (ref 150–440)
RBC: 4.69 10*6/uL (ref 4.40–5.90)
RDW: 20.6 % — ABNORMAL HIGH (ref 11.5–14.5)
WBC: 9.5 x10 3/mm (ref 3.8–10.6)

## 2012-08-12 LAB — CBC CANCER CENTER
Basophil #: 0.1 x10 3/mm (ref 0.0–0.1)
Eosinophil #: 0.5 x10 3/mm (ref 0.0–0.7)
Eosinophil %: 6.7 %
HCT: 34.9 % — ABNORMAL LOW (ref 40.0–52.0)
HGB: 10.9 g/dL — ABNORMAL LOW (ref 13.0–18.0)
Lymphocyte %: 21.2 %
MCH: 23.3 pg — ABNORMAL LOW (ref 26.0–34.0)
MCV: 75 fL — ABNORMAL LOW (ref 80–100)
Monocyte #: 0.6 x10 3/mm (ref 0.2–1.0)
Monocyte %: 7.7 %
Neutrophil #: 4.5 x10 3/mm (ref 1.4–6.5)
RBC: 4.66 10*6/uL (ref 4.40–5.90)
WBC: 7.2 x10 3/mm (ref 3.8–10.6)

## 2012-08-12 LAB — COMPREHENSIVE METABOLIC PANEL
Albumin: 2.6 g/dL — ABNORMAL LOW (ref 3.4–5.0)
Alkaline Phosphatase: 85 U/L (ref 50–136)
Anion Gap: 9 (ref 7–16)
BUN: 17 mg/dL (ref 7–18)
Bilirubin,Total: 0.3 mg/dL (ref 0.2–1.0)
Calcium, Total: 8.3 mg/dL — ABNORMAL LOW (ref 8.5–10.1)
Co2: 27 mmol/L (ref 21–32)
Creatinine: 1.42 mg/dL — ABNORMAL HIGH (ref 0.60–1.30)
EGFR (African American): 53 — ABNORMAL LOW
EGFR (Non-African Amer.): 45 — ABNORMAL LOW
Glucose: 157 mg/dL — ABNORMAL HIGH (ref 65–99)
Osmolality: 284 (ref 275–301)
SGOT(AST): 21 U/L (ref 15–37)
Total Protein: 6.4 g/dL (ref 6.4–8.2)

## 2012-08-19 LAB — CBC CANCER CENTER
Basophil %: 1.4 %
Eosinophil #: 0.2 x10 3/mm (ref 0.0–0.7)
Eosinophil %: 4.8 %
HCT: 33.6 % — ABNORMAL LOW (ref 40.0–52.0)
HGB: 10.6 g/dL — ABNORMAL LOW (ref 13.0–18.0)
MCH: 23.7 pg — ABNORMAL LOW (ref 26.0–34.0)
MCV: 75 fL — ABNORMAL LOW (ref 80–100)
Monocyte #: 0.4 x10 3/mm (ref 0.2–1.0)
Neutrophil #: 3.3 x10 3/mm (ref 1.4–6.5)
Platelet: 226 x10 3/mm (ref 150–440)
RBC: 4.47 10*6/uL (ref 4.40–5.90)
RDW: 20.5 % — ABNORMAL HIGH (ref 11.5–14.5)

## 2012-08-19 LAB — COMPREHENSIVE METABOLIC PANEL
Albumin: 2.7 g/dL — ABNORMAL LOW (ref 3.4–5.0)
BUN: 19 mg/dL — ABNORMAL HIGH (ref 7–18)
Chloride: 106 mmol/L (ref 98–107)
Creatinine: 1.35 mg/dL — ABNORMAL HIGH (ref 0.60–1.30)
EGFR (Non-African Amer.): 48 — ABNORMAL LOW
Glucose: 160 mg/dL — ABNORMAL HIGH (ref 65–99)
Osmolality: 285 (ref 275–301)
Potassium: 4 mmol/L (ref 3.5–5.1)
SGOT(AST): 14 U/L — ABNORMAL LOW (ref 15–37)

## 2012-09-02 LAB — CBC CANCER CENTER
Basophil #: 0.2 x10 3/mm — ABNORMAL HIGH (ref 0.0–0.1)
Eosinophil #: 0.3 x10 3/mm (ref 0.0–0.7)
Eosinophil %: 4.3 %
HCT: 35.8 % — ABNORMAL LOW (ref 40.0–52.0)
HGB: 11.2 g/dL — ABNORMAL LOW (ref 13.0–18.0)
Lymphocyte #: 1.4 x10 3/mm (ref 1.0–3.6)
Neutrophil #: 4 x10 3/mm (ref 1.4–6.5)
Neutrophil %: 53.7 %
Platelet: 232 x10 3/mm (ref 150–440)
RDW: 22 % — ABNORMAL HIGH (ref 11.5–14.5)

## 2012-09-02 LAB — COMPREHENSIVE METABOLIC PANEL
Albumin: 2.8 g/dL — ABNORMAL LOW (ref 3.4–5.0)
Anion Gap: 10 (ref 7–16)
Bilirubin,Total: 0.4 mg/dL (ref 0.2–1.0)
Chloride: 103 mmol/L (ref 98–107)
Co2: 27 mmol/L (ref 21–32)
EGFR (African American): 47 — ABNORMAL LOW
Glucose: 176 mg/dL — ABNORMAL HIGH (ref 65–99)
Potassium: 3.8 mmol/L (ref 3.5–5.1)
Total Protein: 6.5 g/dL (ref 6.4–8.2)

## 2012-09-02 LAB — IRON AND TIBC
Iron Bind.Cap.(Total): 360 ug/dL (ref 250–450)
Unbound Iron-Bind.Cap.: 322 ug/dL

## 2012-09-04 ENCOUNTER — Ambulatory Visit: Payer: Self-pay | Admitting: Oncology

## 2012-09-09 LAB — COMPREHENSIVE METABOLIC PANEL
Albumin: 2.6 g/dL — ABNORMAL LOW (ref 3.4–5.0)
Anion Gap: 7 (ref 7–16)
BUN: 15 mg/dL (ref 7–18)
Bilirubin,Total: 0.4 mg/dL (ref 0.2–1.0)
Calcium, Total: 8.7 mg/dL (ref 8.5–10.1)
Chloride: 104 mmol/L (ref 98–107)
Creatinine: 1.29 mg/dL (ref 0.60–1.30)
EGFR (African American): 59 — ABNORMAL LOW
EGFR (Non-African Amer.): 51 — ABNORMAL LOW
Osmolality: 285 (ref 275–301)
Potassium: 4 mmol/L (ref 3.5–5.1)
Sodium: 140 mmol/L (ref 136–145)
Total Protein: 5.9 g/dL — ABNORMAL LOW (ref 6.4–8.2)

## 2012-09-09 LAB — CBC CANCER CENTER
Basophil #: 0.1 x10 3/mm (ref 0.0–0.1)
Basophil %: 1.6 %
HCT: 32.6 % — ABNORMAL LOW (ref 40.0–52.0)
HGB: 10.4 g/dL — ABNORMAL LOW (ref 13.0–18.0)
Lymphocyte #: 1.1 x10 3/mm (ref 1.0–3.6)
Lymphocyte %: 12.4 %
MCHC: 31.9 g/dL — ABNORMAL LOW (ref 32.0–36.0)
MCV: 76 fL — ABNORMAL LOW (ref 80–100)
Monocyte %: 6.2 %
Platelet: 215 x10 3/mm (ref 150–440)
RBC: 4.29 10*6/uL — ABNORMAL LOW (ref 4.40–5.90)

## 2012-09-16 LAB — BASIC METABOLIC PANEL
Anion Gap: 9 (ref 7–16)
BUN: 17 mg/dL (ref 7–18)
Chloride: 105 mmol/L (ref 98–107)
Co2: 28 mmol/L (ref 21–32)
Creatinine: 1.4 mg/dL — ABNORMAL HIGH (ref 0.60–1.30)
EGFR (African American): 53 — ABNORMAL LOW
EGFR (Non-African Amer.): 46 — ABNORMAL LOW
Osmolality: 291 (ref 275–301)
Potassium: 3.6 mmol/L (ref 3.5–5.1)
Sodium: 142 mmol/L (ref 136–145)

## 2012-09-16 LAB — CBC CANCER CENTER
Lymphocyte %: 20.4 %
MCH: 24.5 pg — ABNORMAL LOW (ref 26.0–34.0)
MCV: 77 fL — ABNORMAL LOW (ref 80–100)
Monocyte #: 0.4 x10 3/mm (ref 0.2–1.0)
Monocyte %: 9.4 %
Neutrophil %: 63.3 %
RDW: 23.2 % — ABNORMAL HIGH (ref 11.5–14.5)

## 2012-09-30 LAB — BASIC METABOLIC PANEL
Calcium, Total: 8.7 mg/dL (ref 8.5–10.1)
Chloride: 101 mmol/L (ref 98–107)
Co2: 29 mmol/L (ref 21–32)
Creatinine: 1.45 mg/dL — ABNORMAL HIGH (ref 0.60–1.30)
EGFR (Non-African Amer.): 44 — ABNORMAL LOW
Glucose: 167 mg/dL — ABNORMAL HIGH (ref 65–99)
Osmolality: 284 (ref 275–301)
Sodium: 139 mmol/L (ref 136–145)

## 2012-09-30 LAB — CBC CANCER CENTER
Eosinophil %: 3.3 %
HCT: 31.6 % — ABNORMAL LOW (ref 40.0–52.0)
Lymphocyte #: 1 x10 3/mm (ref 1.0–3.6)
Lymphocyte %: 15.9 %
MCH: 25 pg — ABNORMAL LOW (ref 26.0–34.0)
MCHC: 31.8 g/dL — ABNORMAL LOW (ref 32.0–36.0)
MCV: 79 fL — ABNORMAL LOW (ref 80–100)
Monocyte #: 1.4 x10 3/mm — ABNORMAL HIGH (ref 0.2–1.0)
Neutrophil #: 3.7 x10 3/mm (ref 1.4–6.5)
Platelet: 246 x10 3/mm (ref 150–440)
RDW: 27.3 % — ABNORMAL HIGH (ref 11.5–14.5)
WBC: 6.6 x10 3/mm (ref 3.8–10.6)

## 2012-10-05 ENCOUNTER — Ambulatory Visit: Payer: Self-pay | Admitting: Oncology

## 2012-10-07 LAB — CBC CANCER CENTER
Basophil #: 0.1 x10 3/mm (ref 0.0–0.1)
Eosinophil %: 2.6 %
HGB: 10.5 g/dL — ABNORMAL LOW (ref 13.0–18.0)
Lymphocyte %: 11.2 %
MCH: 26.1 pg (ref 26.0–34.0)
MCHC: 32.9 g/dL (ref 32.0–36.0)
Monocyte %: 8.8 %
Neutrophil %: 75.5 %
Platelet: 226 x10 3/mm (ref 150–440)
RDW: 26.5 % — ABNORMAL HIGH (ref 11.5–14.5)

## 2012-10-07 LAB — BASIC METABOLIC PANEL
Anion Gap: 7 (ref 7–16)
BUN: 16 mg/dL (ref 7–18)
Chloride: 103 mmol/L (ref 98–107)
EGFR (African American): 56 — ABNORMAL LOW
Potassium: 4 mmol/L (ref 3.5–5.1)

## 2012-10-14 LAB — CBC CANCER CENTER
Basophil %: 1.9 %
Eosinophil #: 0.3 x10 3/mm (ref 0.0–0.7)
Eosinophil %: 6.2 %
HCT: 31.6 % — ABNORMAL LOW (ref 40.0–52.0)
HGB: 10.3 g/dL — ABNORMAL LOW (ref 13.0–18.0)
Lymphocyte %: 20.7 %
MCH: 26.5 pg (ref 26.0–34.0)
MCHC: 32.7 g/dL (ref 32.0–36.0)
MCV: 81 fL (ref 80–100)
Monocyte #: 0.7 x10 3/mm (ref 0.2–1.0)
Neutrophil #: 2.5 x10 3/mm (ref 1.4–6.5)
Platelet: 228 x10 3/mm (ref 150–440)
RBC: 3.89 10*6/uL — ABNORMAL LOW (ref 4.40–5.90)
WBC: 4.5 x10 3/mm (ref 3.8–10.6)

## 2012-10-14 LAB — BASIC METABOLIC PANEL
Anion Gap: 7 (ref 7–16)
BUN: 17 mg/dL (ref 7–18)
Calcium, Total: 8.3 mg/dL — ABNORMAL LOW (ref 8.5–10.1)
Glucose: 176 mg/dL — ABNORMAL HIGH (ref 65–99)
Osmolality: 285 (ref 275–301)
Potassium: 3.8 mmol/L (ref 3.5–5.1)

## 2012-10-31 ENCOUNTER — Inpatient Hospital Stay: Payer: Self-pay | Admitting: Internal Medicine

## 2012-10-31 DIAGNOSIS — R0602 Shortness of breath: Secondary | ICD-10-CM

## 2012-10-31 LAB — URINALYSIS, COMPLETE
Blood: NEGATIVE
Glucose,UR: NEGATIVE mg/dL (ref 0–75)
Ketone: NEGATIVE
Ph: 6 (ref 4.5–8.0)
Protein: NEGATIVE
RBC,UR: 1 /HPF (ref 0–5)
Specific Gravity: 1.008 (ref 1.003–1.030)
Squamous Epithelial: <1
WBC UR: 3 /HPF (ref 0–5)

## 2012-10-31 LAB — BASIC METABOLIC PANEL
Anion Gap: 8 (ref 7–16)
Co2: 30 mmol/L (ref 21–32)
Creatinine: 1.57 mg/dL — ABNORMAL HIGH (ref 0.60–1.30)
EGFR (African American): 46 — ABNORMAL LOW
Glucose: 165 mg/dL — ABNORMAL HIGH (ref 65–99)
Osmolality: 287 (ref 275–301)
Potassium: 3.5 mmol/L (ref 3.5–5.1)
Sodium: 141 mmol/L (ref 136–145)

## 2012-10-31 LAB — CBC WITH DIFFERENTIAL/PLATELET
Basophil %: 2.6 %
Eosinophil %: 3.6 %
HGB: 11.7 g/dL — ABNORMAL LOW (ref 13.0–18.0)
Lymphocyte #: 1.3 10*3/uL (ref 1.0–3.6)
Lymphocyte %: 17.1 %
MCHC: 32.4 g/dL (ref 32.0–36.0)
MCV: 84 fL (ref 80–100)
Monocyte #: 1.2 x10 3/mm — ABNORMAL HIGH (ref 0.2–1.0)
RBC: 4.33 10*6/uL — ABNORMAL LOW (ref 4.40–5.90)
WBC: 7.6 10*3/uL (ref 3.8–10.6)

## 2012-10-31 LAB — CK-MB: CK-MB: 2.9 ng/mL (ref 0.5–3.6)

## 2012-10-31 LAB — TROPONIN I: Troponin-I: 0.03 ng/mL

## 2012-11-01 LAB — BASIC METABOLIC PANEL
Anion Gap: 7 (ref 7–16)
BUN: 20 mg/dL — ABNORMAL HIGH (ref 7–18)
Calcium, Total: 9.1 mg/dL (ref 8.5–10.1)
Chloride: 104 mmol/L (ref 98–107)
Co2: 30 mmol/L (ref 21–32)
Creatinine: 1.54 mg/dL — ABNORMAL HIGH (ref 0.60–1.30)
EGFR (African American): 47 — ABNORMAL LOW
EGFR (Non-African Amer.): 41 — ABNORMAL LOW
Glucose: 252 mg/dL — ABNORMAL HIGH (ref 65–99)
Osmolality: 292 (ref 275–301)
Potassium: 3.9 mmol/L (ref 3.5–5.1)
Sodium: 141 mmol/L (ref 136–145)

## 2012-11-01 LAB — CBC WITH DIFFERENTIAL/PLATELET
Basophil %: 0.6 %
Eosinophil %: 0.1 %
HCT: 34.7 % — ABNORMAL LOW (ref 40.0–52.0)
HGB: 11.5 g/dL — ABNORMAL LOW (ref 13.0–18.0)
Lymphocyte %: 9.2 %
MCV: 83 fL (ref 80–100)
Monocyte #: 0.1 x10 3/mm — ABNORMAL LOW (ref 0.2–1.0)
Monocyte %: 1.6 %
Neutrophil #: 6.2 10*3/uL (ref 1.4–6.5)
Neutrophil %: 88.5 %
RBC: 4.21 10*6/uL — ABNORMAL LOW (ref 4.40–5.90)
WBC: 7.1 10*3/uL (ref 3.8–10.6)

## 2012-11-01 LAB — CK-MB: CK-MB: 3 ng/mL (ref 0.5–3.6)

## 2012-11-01 LAB — TROPONIN I: Troponin-I: 0.02 ng/mL

## 2012-11-03 LAB — BASIC METABOLIC PANEL
BUN: 40 mg/dL — ABNORMAL HIGH (ref 7–18)
Chloride: 106 mmol/L (ref 98–107)
EGFR (Non-African Amer.): 34 — ABNORMAL LOW
Osmolality: 296 (ref 275–301)
Potassium: 4.2 mmol/L (ref 3.5–5.1)
Sodium: 142 mmol/L (ref 136–145)

## 2012-11-04 ENCOUNTER — Ambulatory Visit: Payer: Self-pay | Admitting: Oncology

## 2012-11-04 LAB — CBC WITH DIFFERENTIAL/PLATELET
Basophil #: 0 10*3/uL (ref 0.0–0.1)
Basophil %: 0.1 %
Eosinophil #: 0 10*3/uL (ref 0.0–0.7)
Eosinophil %: 0 %
HGB: 11.5 g/dL — ABNORMAL LOW (ref 13.0–18.0)
Lymphocyte %: 7.2 %
MCHC: 32.5 g/dL (ref 32.0–36.0)
MCV: 84 fL (ref 80–100)
Neutrophil #: 14.7 10*3/uL — ABNORMAL HIGH (ref 1.4–6.5)
Neutrophil %: 84.3 %
Platelet: 248 10*3/uL (ref 150–440)
RDW: 29.4 % — ABNORMAL HIGH (ref 11.5–14.5)
WBC: 17.5 10*3/uL — ABNORMAL HIGH (ref 3.8–10.6)

## 2012-11-04 LAB — BASIC METABOLIC PANEL
Chloride: 106 mmol/L (ref 98–107)
Osmolality: 295 (ref 275–301)
Potassium: 4.5 mmol/L (ref 3.5–5.1)
Sodium: 143 mmol/L (ref 136–145)

## 2012-11-05 LAB — BASIC METABOLIC PANEL
Anion Gap: 4 — ABNORMAL LOW (ref 7–16)
BUN: 33 mg/dL — ABNORMAL HIGH (ref 7–18)
Calcium, Total: 8.8 mg/dL (ref 8.5–10.1)
Chloride: 105 mmol/L (ref 98–107)
Glucose: 154 mg/dL — ABNORMAL HIGH (ref 65–99)
Osmolality: 292 (ref 275–301)
Potassium: 4.1 mmol/L (ref 3.5–5.1)
Sodium: 141 mmol/L (ref 136–145)

## 2012-11-05 LAB — CBC WITH DIFFERENTIAL/PLATELET
HCT: 34.4 % — ABNORMAL LOW (ref 40.0–52.0)
HGB: 11.2 g/dL — ABNORMAL LOW (ref 13.0–18.0)
Lymphocytes: 8 %
MCH: 27.4 pg (ref 26.0–34.0)
MCHC: 32.5 g/dL (ref 32.0–36.0)
MCV: 84 fL (ref 80–100)
Metamyelocyte: 1 %
Monocytes: 9 %
NRBC/100 WBC: 1 /
Platelet: 236 10*3/uL (ref 150–440)
RDW: 29.6 % — ABNORMAL HIGH (ref 11.5–14.5)
Segmented Neutrophils: 77 %
WBC: 15.6 10*3/uL — ABNORMAL HIGH (ref 3.8–10.6)

## 2012-12-05 ENCOUNTER — Ambulatory Visit: Payer: Self-pay | Admitting: Oncology

## 2013-01-06 ENCOUNTER — Ambulatory Visit: Payer: Self-pay | Admitting: Oncology

## 2013-01-15 ENCOUNTER — Ambulatory Visit: Payer: Self-pay | Admitting: Oncology

## 2013-02-04 ENCOUNTER — Ambulatory Visit: Payer: Self-pay | Admitting: Oncology

## 2013-02-19 LAB — URINALYSIS, COMPLETE
Bilirubin,UR: NEGATIVE
Blood: NEGATIVE
Hyaline Cast: 17
Ketone: NEGATIVE
Nitrite: NEGATIVE
Protein: NEGATIVE
Specific Gravity: 1.008 (ref 1.003–1.030)
Squamous Epithelial: 1

## 2013-02-19 LAB — COMPREHENSIVE METABOLIC PANEL
Anion Gap: 5 — ABNORMAL LOW (ref 7–16)
BUN: 18 mg/dL (ref 7–18)
Bilirubin,Total: 0.6 mg/dL (ref 0.2–1.0)
Calcium, Total: 9.5 mg/dL (ref 8.5–10.1)
Creatinine: 1.41 mg/dL — ABNORMAL HIGH (ref 0.60–1.30)
EGFR (Non-African Amer.): 45 — ABNORMAL LOW
Osmolality: 283 (ref 275–301)
Potassium: 4 mmol/L (ref 3.5–5.1)
Sodium: 140 mmol/L (ref 136–145)
Total Protein: 7.3 g/dL (ref 6.4–8.2)

## 2013-02-19 LAB — CBC
HGB: 15.6 g/dL (ref 13.0–18.0)
MCHC: 34.2 g/dL (ref 32.0–36.0)
RBC: 5.01 10*6/uL (ref 4.40–5.90)
RDW: 17.3 % — ABNORMAL HIGH (ref 11.5–14.5)
WBC: 8.8 10*3/uL (ref 3.8–10.6)

## 2013-02-19 LAB — LIPASE, BLOOD: Lipase: 227 U/L (ref 73–393)

## 2013-02-20 ENCOUNTER — Inpatient Hospital Stay: Payer: Self-pay | Admitting: Internal Medicine

## 2013-02-20 LAB — TROPONIN I: Troponin-I: 0.02 ng/mL

## 2013-02-20 LAB — MAGNESIUM: Magnesium: 1.1 mg/dL — ABNORMAL LOW

## 2013-02-20 LAB — PHOSPHORUS: Phosphorus: 3.6 mg/dL (ref 2.5–4.9)

## 2013-02-21 LAB — BASIC METABOLIC PANEL
Anion Gap: 7 (ref 7–16)
BUN: 15 mg/dL (ref 7–18)
Calcium, Total: 8.6 mg/dL (ref 8.5–10.1)
Chloride: 109 mmol/L — ABNORMAL HIGH (ref 98–107)
EGFR (African American): >60
Potassium: 4 mmol/L (ref 3.5–5.1)
Sodium: 144 mmol/L (ref 136–145)

## 2013-02-21 LAB — CBC WITH DIFFERENTIAL/PLATELET
Basophil %: 1.5 %
Eosinophil #: 0.6 10*3/uL (ref 0.0–0.7)
Eosinophil %: 8.1 %
HCT: 39.8 % — ABNORMAL LOW (ref 40.0–52.0)
HGB: 13.1 g/dL (ref 13.0–18.0)
Lymphocyte #: 1 10*3/uL (ref 1.0–3.6)
Lymphocyte %: 12.1 %
MCH: 30.5 pg (ref 26.0–34.0)
MCHC: 33 g/dL (ref 32.0–36.0)
MCV: 93 fL (ref 80–100)
Monocyte #: 0.9 x10 3/mm (ref 0.2–1.0)
Neutrophil #: 5.3 10*3/uL (ref 1.4–6.5)
Neutrophil %: 67.4 %
RBC: 4.3 10*6/uL — ABNORMAL LOW (ref 4.40–5.90)
RDW: 16.7 % — ABNORMAL HIGH (ref 11.5–14.5)
WBC: 7.9 10*3/uL (ref 3.8–10.6)

## 2013-02-22 LAB — COMPREHENSIVE METABOLIC PANEL
Albumin: 2.7 g/dL — ABNORMAL LOW (ref 3.4–5.0)
Anion Gap: 6 — ABNORMAL LOW (ref 7–16)
BUN: 13 mg/dL (ref 7–18)
Bilirubin,Total: 0.5 mg/dL (ref 0.2–1.0)
Calcium, Total: 8.5 mg/dL (ref 8.5–10.1)
Co2: 27 mmol/L (ref 21–32)
EGFR (African American): >60
Glucose: 105 mg/dL — ABNORMAL HIGH (ref 65–99)
Osmolality: 284 (ref 275–301)
Potassium: 4 mmol/L (ref 3.5–5.1)
SGOT(AST): 26 U/L (ref 15–37)
SGPT (ALT): 19 U/L (ref 12–78)
Sodium: 142 mmol/L (ref 136–145)
Total Protein: 6.2 g/dL — ABNORMAL LOW (ref 6.4–8.2)

## 2013-02-22 LAB — CBC WITH DIFFERENTIAL/PLATELET
Eosinophil #: 0.7 10*3/uL (ref 0.0–0.7)
Lymphocyte #: 1 10*3/uL (ref 1.0–3.6)
Lymphocyte %: 13.4 %
MCH: 30.5 pg (ref 26.0–34.0)
MCHC: 32.8 g/dL (ref 32.0–36.0)
Monocyte #: 1 x10 3/mm (ref 0.2–1.0)
Neutrophil #: 4.9 10*3/uL (ref 1.4–6.5)
Neutrophil %: 62.9 %
RBC: 4.4 10*6/uL (ref 4.40–5.90)

## 2013-02-24 LAB — PATHOLOGY REPORT

## 2013-03-07 ENCOUNTER — Ambulatory Visit: Payer: Self-pay | Admitting: Oncology

## 2013-03-17 ENCOUNTER — Ambulatory Visit: Payer: Self-pay | Admitting: Oncology

## 2013-04-06 ENCOUNTER — Ambulatory Visit: Payer: Self-pay | Admitting: Oncology

## 2013-04-28 ENCOUNTER — Ambulatory Visit: Payer: Self-pay | Admitting: Oncology

## 2013-05-07 ENCOUNTER — Ambulatory Visit: Payer: Self-pay | Admitting: Oncology

## 2013-05-12 ENCOUNTER — Ambulatory Visit: Payer: Self-pay | Admitting: Oncology

## 2013-05-12 LAB — COMPREHENSIVE METABOLIC PANEL
ALK PHOS: 102 U/L
ANION GAP: 7 (ref 7–16)
Albumin: 2.7 g/dL — ABNORMAL LOW (ref 3.4–5.0)
BILIRUBIN TOTAL: 0.4 mg/dL (ref 0.2–1.0)
BUN: 16 mg/dL (ref 7–18)
Calcium, Total: 8.7 mg/dL (ref 8.5–10.1)
Chloride: 107 mmol/L (ref 98–107)
Co2: 29 mmol/L (ref 21–32)
Creatinine: 1.21 mg/dL (ref 0.60–1.30)
EGFR (African American): >60
EGFR (Non-African Amer.): 55 — ABNORMAL LOW
Glucose: 121 mg/dL — ABNORMAL HIGH (ref 65–99)
OSMOLALITY: 287 (ref 275–301)
POTASSIUM: 3.7 mmol/L (ref 3.5–5.1)
SGOT(AST): 23 U/L (ref 15–37)
SGPT (ALT): 24 U/L (ref 12–78)
Sodium: 143 mmol/L (ref 136–145)
Total Protein: 6.6 g/dL (ref 6.4–8.2)

## 2013-05-12 LAB — CBC CANCER CENTER
Basophil #: 0.2 x10 3/mm — ABNORMAL HIGH (ref 0.0–0.1)
Basophil %: 2.2 %
Eosinophil #: 0.4 x10 3/mm (ref 0.0–0.7)
Eosinophil %: 4.2 %
HCT: 42.6 % (ref 40.0–52.0)
HGB: 13.6 g/dL (ref 13.0–18.0)
LYMPHS ABS: 1.3 x10 3/mm (ref 1.0–3.6)
LYMPHS PCT: 13.2 %
MCH: 30.2 pg (ref 26.0–34.0)
MCHC: 31.8 g/dL — AB (ref 32.0–36.0)
MCV: 95 fL (ref 80–100)
Monocyte #: 1 x10 3/mm (ref 0.2–1.0)
Monocyte %: 9.6 %
Neutrophil #: 7.2 x10 3/mm — ABNORMAL HIGH (ref 1.4–6.5)
Neutrophil %: 70.8 %
Platelet: 178 x10 3/mm (ref 150–440)
RBC: 4.49 10*6/uL (ref 4.40–5.90)
RDW: 15.7 % — AB (ref 11.5–14.5)
WBC: 10.2 x10 3/mm (ref 3.8–10.6)

## 2013-05-19 LAB — COMPREHENSIVE METABOLIC PANEL
ALBUMIN: 2.7 g/dL — AB (ref 3.4–5.0)
ALK PHOS: 86 U/L
ALT: 44 U/L (ref 12–78)
ANION GAP: 1 — AB (ref 7–16)
AST: 29 U/L (ref 15–37)
BUN: 23 mg/dL — ABNORMAL HIGH (ref 7–18)
Bilirubin,Total: 0.7 mg/dL (ref 0.2–1.0)
CHLORIDE: 107 mmol/L (ref 98–107)
CREATININE: 1.14 mg/dL (ref 0.60–1.30)
Calcium, Total: 8.9 mg/dL (ref 8.5–10.1)
Co2: 32 mmol/L (ref 21–32)
EGFR (African American): >60
EGFR (Non-African Amer.): 59 — ABNORMAL LOW
Glucose: 183 mg/dL — ABNORMAL HIGH (ref 65–99)
Osmolality: 288 (ref 275–301)
Potassium: 3.5 mmol/L (ref 3.5–5.1)
Sodium: 140 mmol/L (ref 136–145)
TOTAL PROTEIN: 6.1 g/dL — AB (ref 6.4–8.2)

## 2013-05-19 LAB — CBC CANCER CENTER
BASOS ABS: 0.1 x10 3/mm (ref 0.0–0.1)
Basophil %: 0.8 %
Eosinophil #: 0.3 x10 3/mm (ref 0.0–0.7)
Eosinophil %: 4.1 %
HCT: 39.9 % — ABNORMAL LOW (ref 40.0–52.0)
HGB: 13.1 g/dL (ref 13.0–18.0)
Lymphocyte #: 1 x10 3/mm (ref 1.0–3.6)
Lymphocyte %: 12.9 %
MCH: 30.2 pg (ref 26.0–34.0)
MCHC: 32.8 g/dL (ref 32.0–36.0)
MCV: 92 fL (ref 80–100)
MONOS PCT: 4.4 %
Monocyte #: 0.4 x10 3/mm (ref 0.2–1.0)
NEUTROS ABS: 6.2 x10 3/mm (ref 1.4–6.5)
Neutrophil %: 77.8 %
Platelet: 135 x10 3/mm — ABNORMAL LOW (ref 150–440)
RBC: 4.33 10*6/uL — ABNORMAL LOW (ref 4.40–5.90)
RDW: 15.3 % — AB (ref 11.5–14.5)
WBC: 8 x10 3/mm (ref 3.8–10.6)

## 2013-06-02 LAB — COMPREHENSIVE METABOLIC PANEL
ALBUMIN: 2.6 g/dL — AB (ref 3.4–5.0)
ALK PHOS: 78 U/L
Anion Gap: 5 — ABNORMAL LOW (ref 7–16)
BILIRUBIN TOTAL: 0.2 mg/dL (ref 0.2–1.0)
BUN: 15 mg/dL (ref 7–18)
Calcium, Total: 8 mg/dL — ABNORMAL LOW (ref 8.5–10.1)
Chloride: 109 mmol/L — ABNORMAL HIGH (ref 98–107)
Co2: 29 mmol/L (ref 21–32)
Creatinine: 1.21 mg/dL (ref 0.60–1.30)
EGFR (African American): >60
GFR CALC NON AF AMER: 55 — AB
GLUCOSE: 137 mg/dL — AB (ref 65–99)
OSMOLALITY: 288 (ref 275–301)
POTASSIUM: 3.6 mmol/L (ref 3.5–5.1)
SGOT(AST): 17 U/L (ref 15–37)
SGPT (ALT): 17 U/L (ref 12–78)
SODIUM: 143 mmol/L (ref 136–145)
Total Protein: 5.8 g/dL — ABNORMAL LOW (ref 6.4–8.2)

## 2013-06-02 LAB — CBC CANCER CENTER
BASOS ABS: 0.2 x10 3/mm — AB (ref 0.0–0.1)
Basophil %: 3.8 %
EOS PCT: 2.4 %
Eosinophil #: 0.1 x10 3/mm (ref 0.0–0.7)
HCT: 37 % — ABNORMAL LOW (ref 40.0–52.0)
HGB: 11.8 g/dL — AB (ref 13.0–18.0)
Lymphocyte #: 1.2 x10 3/mm (ref 1.0–3.6)
Lymphocyte %: 25.4 %
MCH: 29.4 pg (ref 26.0–34.0)
MCHC: 31.9 g/dL — ABNORMAL LOW (ref 32.0–36.0)
MCV: 92 fL (ref 80–100)
MONO ABS: 1.2 x10 3/mm — AB (ref 0.2–1.0)
Monocyte %: 26.8 %
Neutrophil #: 1.9 x10 3/mm (ref 1.4–6.5)
Neutrophil %: 41.6 %
PLATELETS: 324 x10 3/mm (ref 150–440)
RBC: 4.01 10*6/uL — ABNORMAL LOW (ref 4.40–5.90)
RDW: 15.3 % — AB (ref 11.5–14.5)
WBC: 4.7 x10 3/mm (ref 3.8–10.6)

## 2013-06-07 ENCOUNTER — Ambulatory Visit: Payer: Self-pay | Admitting: Oncology

## 2013-06-09 LAB — COMPREHENSIVE METABOLIC PANEL
ALK PHOS: 72 U/L
Albumin: 2.6 g/dL — ABNORMAL LOW (ref 3.4–5.0)
Anion Gap: 8 (ref 7–16)
BUN: 19 mg/dL — ABNORMAL HIGH (ref 7–18)
Bilirubin,Total: 0.5 mg/dL (ref 0.2–1.0)
CALCIUM: 7.9 mg/dL — AB (ref 8.5–10.1)
CO2: 28 mmol/L (ref 21–32)
Chloride: 109 mmol/L — ABNORMAL HIGH (ref 98–107)
Creatinine: 1.08 mg/dL (ref 0.60–1.30)
EGFR (Non-African Amer.): >60
Glucose: 129 mg/dL — ABNORMAL HIGH (ref 65–99)
Osmolality: 293 (ref 275–301)
Potassium: 3.7 mmol/L (ref 3.5–5.1)
SGOT(AST): 18 U/L (ref 15–37)
SGPT (ALT): 23 U/L (ref 12–78)
Sodium: 145 mmol/L (ref 136–145)
Total Protein: 5.6 g/dL — ABNORMAL LOW (ref 6.4–8.2)

## 2013-06-09 LAB — CBC CANCER CENTER
Basophil #: 0.1 x10 3/mm (ref 0.0–0.1)
Basophil %: 1.1 %
EOS PCT: 0.4 %
Eosinophil #: 0 x10 3/mm (ref 0.0–0.7)
HCT: 36.2 % — ABNORMAL LOW (ref 40.0–52.0)
HGB: 11.7 g/dL — ABNORMAL LOW (ref 13.0–18.0)
Lymphocyte #: 1.1 x10 3/mm (ref 1.0–3.6)
Lymphocyte %: 14 %
MCH: 29.6 pg (ref 26.0–34.0)
MCHC: 32.4 g/dL (ref 32.0–36.0)
MCV: 91 fL (ref 80–100)
MONOS PCT: 3.1 %
Monocyte #: 0.2 x10 3/mm (ref 0.2–1.0)
NEUTROS ABS: 6.5 x10 3/mm (ref 1.4–6.5)
Neutrophil %: 81.4 %
PLATELETS: 177 x10 3/mm (ref 150–440)
RBC: 3.96 10*6/uL — AB (ref 4.40–5.90)
RDW: 15.5 % — ABNORMAL HIGH (ref 11.5–14.5)
WBC: 8 x10 3/mm (ref 3.8–10.6)

## 2013-07-05 ENCOUNTER — Ambulatory Visit: Payer: Self-pay | Admitting: Oncology

## 2013-07-07 LAB — COMPREHENSIVE METABOLIC PANEL
ALBUMIN: 2.7 g/dL — AB (ref 3.4–5.0)
ALK PHOS: 80 U/L
ANION GAP: 7 (ref 7–16)
AST: 20 U/L (ref 15–37)
BUN: 18 mg/dL (ref 7–18)
Bilirubin,Total: 0.6 mg/dL (ref 0.2–1.0)
CO2: 29 mmol/L (ref 21–32)
Calcium, Total: 8.1 mg/dL — ABNORMAL LOW (ref 8.5–10.1)
Chloride: 109 mmol/L — ABNORMAL HIGH (ref 98–107)
Creatinine: 1.21 mg/dL (ref 0.60–1.30)
EGFR (Non-African Amer.): 55 — ABNORMAL LOW
Glucose: 123 mg/dL — ABNORMAL HIGH (ref 65–99)
Osmolality: 292 (ref 275–301)
Potassium: 3.6 mmol/L (ref 3.5–5.1)
SGPT (ALT): 15 U/L (ref 12–78)
SODIUM: 145 mmol/L (ref 136–145)
Total Protein: 5.8 g/dL — ABNORMAL LOW (ref 6.4–8.2)

## 2013-07-07 LAB — CBC CANCER CENTER
BASOS PCT: 2.3 %
Basophil #: 0.3 x10 3/mm — ABNORMAL HIGH (ref 0.0–0.1)
Eosinophil #: 0.2 x10 3/mm (ref 0.0–0.7)
Eosinophil %: 1.4 %
HCT: 39.4 % — ABNORMAL LOW (ref 40.0–52.0)
HGB: 12.5 g/dL — AB (ref 13.0–18.0)
LYMPHS PCT: 13 %
Lymphocyte #: 1.4 x10 3/mm (ref 1.0–3.6)
MCH: 29.1 pg (ref 26.0–34.0)
MCHC: 31.8 g/dL — ABNORMAL LOW (ref 32.0–36.0)
MCV: 92 fL (ref 80–100)
MONOS PCT: 9.1 %
Monocyte #: 1 x10 3/mm (ref 0.2–1.0)
Neutrophil #: 8 x10 3/mm — ABNORMAL HIGH (ref 1.4–6.5)
Neutrophil %: 74.2 %
Platelet: 161 x10 3/mm (ref 150–440)
RBC: 4.3 10*6/uL — ABNORMAL LOW (ref 4.40–5.90)
RDW: 17.5 % — ABNORMAL HIGH (ref 11.5–14.5)
WBC: 10.8 x10 3/mm — ABNORMAL HIGH (ref 3.8–10.6)

## 2013-07-14 LAB — CBC CANCER CENTER
Basophil #: 0.1 x10 3/mm (ref 0.0–0.1)
Basophil %: 1.7 %
EOS PCT: 10.1 %
Eosinophil #: 0.8 x10 3/mm — ABNORMAL HIGH (ref 0.0–0.7)
HCT: 37.3 % — ABNORMAL LOW (ref 40.0–52.0)
HGB: 11.9 g/dL — AB (ref 13.0–18.0)
Lymphocyte #: 1.2 x10 3/mm (ref 1.0–3.6)
Lymphocyte %: 16.4 %
MCH: 29.1 pg (ref 26.0–34.0)
MCHC: 31.8 g/dL — ABNORMAL LOW (ref 32.0–36.0)
MCV: 92 fL (ref 80–100)
MONOS PCT: 4.5 %
Monocyte #: 0.3 x10 3/mm (ref 0.2–1.0)
Neutrophil #: 5 x10 3/mm (ref 1.4–6.5)
Neutrophil %: 67.3 %
Platelet: 119 x10 3/mm — ABNORMAL LOW (ref 150–440)
RBC: 4.08 10*6/uL — AB (ref 4.40–5.90)
RDW: 18.2 % — ABNORMAL HIGH (ref 11.5–14.5)
WBC: 7.4 x10 3/mm (ref 3.8–10.6)

## 2013-07-14 LAB — BASIC METABOLIC PANEL
Anion Gap: 5 — ABNORMAL LOW (ref 7–16)
BUN: 22 mg/dL — ABNORMAL HIGH (ref 7–18)
CHLORIDE: 106 mmol/L (ref 98–107)
Calcium, Total: 9.1 mg/dL (ref 8.5–10.1)
Co2: 31 mmol/L (ref 21–32)
Creatinine: 1.18 mg/dL (ref 0.60–1.30)
EGFR (Non-African Amer.): 56 — ABNORMAL LOW
Glucose: 125 mg/dL — ABNORMAL HIGH (ref 65–99)
OSMOLALITY: 288 (ref 275–301)
POTASSIUM: 3.6 mmol/L (ref 3.5–5.1)
SODIUM: 142 mmol/L (ref 136–145)

## 2013-07-28 LAB — BASIC METABOLIC PANEL
ANION GAP: 6 — AB (ref 7–16)
BUN: 15 mg/dL (ref 7–18)
CHLORIDE: 110 mmol/L — AB (ref 98–107)
CO2: 29 mmol/L (ref 21–32)
CREATININE: 1.13 mg/dL (ref 0.60–1.30)
Calcium, Total: 8.5 mg/dL (ref 8.5–10.1)
GFR CALC NON AF AMER: 59 — AB
Glucose: 114 mg/dL — ABNORMAL HIGH (ref 65–99)
Osmolality: 290 (ref 275–301)
POTASSIUM: 3.7 mmol/L (ref 3.5–5.1)
Sodium: 145 mmol/L (ref 136–145)

## 2013-07-28 LAB — CBC CANCER CENTER
BASOS ABS: 0.2 x10 3/mm — AB (ref 0.0–0.1)
Basophil %: 4.5 %
EOS PCT: 5.6 %
Eosinophil #: 0.2 x10 3/mm (ref 0.0–0.7)
HCT: 34.5 % — AB (ref 40.0–52.0)
HGB: 10.8 g/dL — ABNORMAL LOW (ref 13.0–18.0)
Lymphocyte #: 1.2 x10 3/mm (ref 1.0–3.6)
Lymphocyte %: 29.8 %
MCH: 28.5 pg (ref 26.0–34.0)
MCHC: 31.2 g/dL — ABNORMAL LOW (ref 32.0–36.0)
MCV: 91 fL (ref 80–100)
Monocyte #: 1.3 x10 3/mm — ABNORMAL HIGH (ref 0.2–1.0)
Monocyte %: 31.2 %
NEUTROS ABS: 1.2 x10 3/mm — AB (ref 1.4–6.5)
Neutrophil %: 28.9 %
PLATELETS: 302 x10 3/mm (ref 150–440)
RBC: 3.78 10*6/uL — ABNORMAL LOW (ref 4.40–5.90)
RDW: 18.4 % — ABNORMAL HIGH (ref 11.5–14.5)
WBC: 4.1 x10 3/mm (ref 3.8–10.6)

## 2013-08-04 LAB — CBC CANCER CENTER
Basophil #: 0.1 x10 3/mm (ref 0.0–0.1)
Basophil %: 1.9 %
EOS ABS: 0 x10 3/mm (ref 0.0–0.7)
EOS PCT: 0.5 %
HCT: 33.1 % — AB (ref 40.0–52.0)
HGB: 10.6 g/dL — ABNORMAL LOW (ref 13.0–18.0)
Lymphocyte #: 1.1 x10 3/mm (ref 1.0–3.6)
Lymphocyte %: 16.1 %
MCH: 28.4 pg (ref 26.0–34.0)
MCHC: 31.9 g/dL — AB (ref 32.0–36.0)
MCV: 89 fL (ref 80–100)
MONOS PCT: 4.2 %
Monocyte #: 0.3 x10 3/mm (ref 0.2–1.0)
Neutrophil #: 5.5 x10 3/mm (ref 1.4–6.5)
Neutrophil %: 77.3 %
PLATELETS: 189 x10 3/mm (ref 150–440)
RBC: 3.71 10*6/uL — ABNORMAL LOW (ref 4.40–5.90)
RDW: 18.7 % — ABNORMAL HIGH (ref 11.5–14.5)
WBC: 7.1 x10 3/mm (ref 3.8–10.6)

## 2013-08-04 LAB — BASIC METABOLIC PANEL
Anion Gap: 6 — ABNORMAL LOW (ref 7–16)
BUN: 18 mg/dL (ref 7–18)
CHLORIDE: 107 mmol/L (ref 98–107)
CO2: 29 mmol/L (ref 21–32)
Calcium, Total: 8.6 mg/dL (ref 8.5–10.1)
Creatinine: 1.07 mg/dL (ref 0.60–1.30)
EGFR (African American): >60
EGFR (Non-African Amer.): >60
GLUCOSE: 125 mg/dL — AB (ref 65–99)
OSMOLALITY: 286 (ref 275–301)
Potassium: 3.6 mmol/L (ref 3.5–5.1)
SODIUM: 142 mmol/L (ref 136–145)

## 2013-08-05 ENCOUNTER — Ambulatory Visit: Payer: Self-pay | Admitting: Oncology

## 2013-08-15 ENCOUNTER — Inpatient Hospital Stay: Payer: Self-pay | Admitting: Internal Medicine

## 2013-08-15 LAB — URINALYSIS, COMPLETE
Bilirubin,UR: NEGATIVE
Blood: NEGATIVE
Glucose,UR: NEGATIVE mg/dL (ref 0–75)
Leukocyte Esterase: NEGATIVE
Nitrite: NEGATIVE
Ph: 5 (ref 4.5–8.0)
Protein: NEGATIVE
RBC,UR: <1 /HPF (ref 0–5)
SPECIFIC GRAVITY: 1.021 (ref 1.003–1.030)
SQUAMOUS EPITHELIAL: NONE SEEN
WBC UR: 1 /HPF (ref 0–5)

## 2013-08-15 LAB — DIFFERENTIAL
Basophil #: 0.1 10*3/uL (ref 0.0–0.1)
Basophil %: 4.3 %
EOS PCT: 0.2 %
Eosinophil #: 0 10*3/uL (ref 0.0–0.7)
LYMPHS ABS: 0.4 10*3/uL — AB (ref 1.0–3.6)
Lymphocyte %: 16 %
Monocyte #: 0.8 x10 3/mm (ref 0.2–1.0)
Monocyte %: 30.4 %
NEUTROS ABS: 1.4 10*3/uL (ref 1.4–6.5)
NEUTROS PCT: 49.1 %

## 2013-08-15 LAB — CBC
HCT: 33.5 % — AB (ref 40.0–52.0)
HGB: 10.5 g/dL — ABNORMAL LOW (ref 13.0–18.0)
MCH: 28.4 pg (ref 26.0–34.0)
MCHC: 31.5 g/dL — ABNORMAL LOW (ref 32.0–36.0)
MCV: 90 fL (ref 80–100)
Platelet: 233 10*3/uL (ref 150–440)
RBC: 3.71 10*6/uL — AB (ref 4.40–5.90)
RDW: 19.9 % — ABNORMAL HIGH (ref 11.5–14.5)
WBC: 2.8 10*3/uL — ABNORMAL LOW (ref 3.8–10.6)

## 2013-08-15 LAB — COMPREHENSIVE METABOLIC PANEL
ALBUMIN: 2.6 g/dL — AB (ref 3.4–5.0)
ALK PHOS: 77 U/L
AST: 20 U/L (ref 15–37)
Anion Gap: 4 — ABNORMAL LOW (ref 7–16)
BILIRUBIN TOTAL: 1 mg/dL (ref 0.2–1.0)
BUN: 19 mg/dL — ABNORMAL HIGH (ref 7–18)
CREATININE: 1.25 mg/dL (ref 0.60–1.30)
Calcium, Total: 8.3 mg/dL — ABNORMAL LOW (ref 8.5–10.1)
Chloride: 108 mmol/L — ABNORMAL HIGH (ref 98–107)
Co2: 29 mmol/L (ref 21–32)
GFR CALC NON AF AMER: 53 — AB
Glucose: 141 mg/dL — ABNORMAL HIGH (ref 65–99)
Osmolality: 286 (ref 275–301)
POTASSIUM: 3.6 mmol/L (ref 3.5–5.1)
SGPT (ALT): 19 U/L (ref 12–78)
SODIUM: 141 mmol/L (ref 136–145)
Total Protein: 5.7 g/dL — ABNORMAL LOW (ref 6.4–8.2)

## 2013-08-15 LAB — HEMOGLOBIN: HGB: 12 g/dL — AB (ref 13.0–18.0)

## 2013-08-15 LAB — CK TOTAL AND CKMB (NOT AT ARMC)
CK, TOTAL: 106 U/L
CK-MB: 2.1 ng/mL (ref 0.5–3.6)

## 2013-08-15 LAB — TROPONIN I
TROPONIN-I: 0.14 ng/mL — AB
Troponin-I: 0.1 ng/mL — ABNORMAL HIGH
Troponin-I: 0.11 ng/mL — ABNORMAL HIGH

## 2013-08-15 LAB — APTT: Activated PTT: 34 secs (ref 23.6–35.9)

## 2013-08-15 LAB — D-DIMER(ARMC): D-DIMER: 5107 ng/mL

## 2013-08-16 LAB — BASIC METABOLIC PANEL
Anion Gap: 6 — ABNORMAL LOW (ref 7–16)
BUN: 19 mg/dL — ABNORMAL HIGH (ref 7–18)
CALCIUM: 7.9 mg/dL — AB (ref 8.5–10.1)
CREATININE: 1.29 mg/dL (ref 0.60–1.30)
Chloride: 109 mmol/L — ABNORMAL HIGH (ref 98–107)
Co2: 28 mmol/L (ref 21–32)
EGFR (Non-African Amer.): 51 — ABNORMAL LOW
GFR CALC AF AMER: 59 — AB
GLUCOSE: 126 mg/dL — AB (ref 65–99)
Osmolality: 289 (ref 275–301)
Potassium: 3.1 mmol/L — ABNORMAL LOW (ref 3.5–5.1)
Sodium: 143 mmol/L (ref 136–145)

## 2013-08-16 LAB — CBC WITH DIFFERENTIAL/PLATELET
BASOS PCT: 4.4 %
Basophil #: 0.1 10*3/uL (ref 0.0–0.1)
EOS ABS: 0 10*3/uL (ref 0.0–0.7)
Eosinophil %: 0.2 %
HCT: 29.3 % — AB (ref 40.0–52.0)
HGB: 8.9 g/dL — AB (ref 13.0–18.0)
LYMPHS ABS: 0.2 10*3/uL — AB (ref 1.0–3.6)
Lymphocyte %: 14.8 %
MCH: 27.2 pg (ref 26.0–34.0)
MCHC: 30.4 g/dL — ABNORMAL LOW (ref 32.0–36.0)
MCV: 90 fL (ref 80–100)
Monocyte #: 0.3 x10 3/mm (ref 0.2–1.0)
Monocyte %: 23.7 %
NEUTROS ABS: 0.7 10*3/uL — AB (ref 1.4–6.5)
NEUTROS PCT: 56.9 %
PLATELETS: 181 10*3/uL (ref 150–440)
RBC: 3.27 10*6/uL — ABNORMAL LOW (ref 4.40–5.90)
RDW: 19.7 % — ABNORMAL HIGH (ref 11.5–14.5)
WBC: 1.2 10*3/uL — AB (ref 3.8–10.6)

## 2013-08-16 LAB — OCCULT BLOOD X 1 CARD TO LAB, STOOL: Occult Blood, Feces: NEGATIVE

## 2013-08-16 LAB — HEMOGLOBIN
HGB: 9.6 g/dL — ABNORMAL LOW (ref 13.0–18.0)
HGB: 10.4 g/dL — ABNORMAL LOW (ref 13.0–18.0)

## 2013-08-16 LAB — APTT: ACTIVATED PTT: 75.5 s — AB (ref 23.6–35.9)

## 2013-08-17 LAB — BASIC METABOLIC PANEL
ANION GAP: 6 — AB (ref 7–16)
BUN: 29 mg/dL — AB (ref 7–18)
CALCIUM: 8.2 mg/dL — AB (ref 8.5–10.1)
CHLORIDE: 108 mmol/L — AB (ref 98–107)
Co2: 28 mmol/L (ref 21–32)
Creatinine: 1.42 mg/dL — ABNORMAL HIGH (ref 0.60–1.30)
GFR CALC AF AMER: 52 — AB
GFR CALC NON AF AMER: 45 — AB
GLUCOSE: 100 mg/dL — AB (ref 65–99)
OSMOLALITY: 289 (ref 275–301)
Potassium: 3.7 mmol/L (ref 3.5–5.1)
Sodium: 142 mmol/L (ref 136–145)

## 2013-08-17 LAB — APTT
Activated PTT: 152.4 secs — ABNORMAL HIGH (ref 23.6–35.9)
Activated PTT: 117.6 secs — ABNORMAL HIGH (ref 23.6–35.9)
Activated PTT: 46.2 secs — ABNORMAL HIGH (ref 23.6–35.9)

## 2013-08-17 LAB — WBC: WBC: 8.2 10*3/uL (ref 3.8–10.6)

## 2013-08-17 LAB — PHOSPHORUS: Phosphorus: 2.9 mg/dL (ref 2.5–4.9)

## 2013-08-17 LAB — MAGNESIUM: MAGNESIUM: 1.4 mg/dL — AB

## 2013-08-18 LAB — APTT: Activated PTT: 115.7 secs — ABNORMAL HIGH (ref 23.6–35.9)

## 2013-08-18 LAB — CBC WITH DIFFERENTIAL/PLATELET
Bands: 17 %
Comment - H1-Com4: NORMAL
HCT: 30.4 % — AB (ref 40.0–52.0)
HGB: 9.7 g/dL — ABNORMAL LOW (ref 13.0–18.0)
Lymphocytes: 6 %
MCH: 28.2 pg (ref 26.0–34.0)
MCHC: 31.8 g/dL — ABNORMAL LOW (ref 32.0–36.0)
MCV: 89 fL (ref 80–100)
MYELOCYTE: 3 %
Metamyelocyte: 2 %
Monocytes: 11 %
PLATELETS: 199 10*3/uL (ref 150–440)
RBC: 3.42 10*6/uL — ABNORMAL LOW (ref 4.40–5.90)
RDW: 20.3 % — AB (ref 11.5–14.5)
Segmented Neutrophils: 60 %
Variant Lymphocyte - H1-Rlymph: 1 %
WBC: 16.5 10*3/uL — AB (ref 3.8–10.6)

## 2013-08-18 LAB — PROTIME-INR
INR: 0.9
Prothrombin Time: 12.5 secs (ref 11.5–14.7)

## 2013-08-18 LAB — BASIC METABOLIC PANEL
ANION GAP: 6 — AB (ref 7–16)
BUN: 31 mg/dL — ABNORMAL HIGH (ref 7–18)
CALCIUM: 8 mg/dL — AB (ref 8.5–10.1)
CREATININE: 1.36 mg/dL — AB (ref 0.60–1.30)
Chloride: 108 mmol/L — ABNORMAL HIGH (ref 98–107)
Co2: 28 mmol/L (ref 21–32)
EGFR (Non-African Amer.): 47 — ABNORMAL LOW
GFR CALC AF AMER: 55 — AB
Glucose: 115 mg/dL — ABNORMAL HIGH (ref 65–99)
Osmolality: 291 (ref 275–301)
Potassium: 3.5 mmol/L (ref 3.5–5.1)
SODIUM: 142 mmol/L (ref 136–145)

## 2013-08-18 LAB — MAGNESIUM: MAGNESIUM: 1.6 mg/dL — AB

## 2013-08-19 LAB — BASIC METABOLIC PANEL
Anion Gap: 6 — ABNORMAL LOW (ref 7–16)
BUN: 28 mg/dL — ABNORMAL HIGH (ref 7–18)
CHLORIDE: 110 mmol/L — AB (ref 98–107)
CO2: 27 mmol/L (ref 21–32)
Calcium, Total: 8.5 mg/dL (ref 8.5–10.1)
Creatinine: 1.5 mg/dL — ABNORMAL HIGH (ref 0.60–1.30)
EGFR (African American): 49 — ABNORMAL LOW
GFR CALC NON AF AMER: 42 — AB
Glucose: 106 mg/dL — ABNORMAL HIGH (ref 65–99)
Osmolality: 291 (ref 275–301)
Potassium: 3.8 mmol/L (ref 3.5–5.1)
SODIUM: 143 mmol/L (ref 136–145)

## 2013-08-19 LAB — CBC WITH DIFFERENTIAL/PLATELET
Basophil #: 0.1 10*3/uL (ref 0.0–0.1)
Basophil %: 0.7 %
EOS ABS: 0.1 10*3/uL (ref 0.0–0.7)
EOS PCT: 0.3 %
HCT: 29.8 % — AB (ref 40.0–52.0)
HGB: 9.3 g/dL — ABNORMAL LOW (ref 13.0–18.0)
LYMPHS PCT: 7.5 %
Lymphocyte #: 1.5 10*3/uL (ref 1.0–3.6)
MCH: 27.5 pg (ref 26.0–34.0)
MCHC: 31.2 g/dL — ABNORMAL LOW (ref 32.0–36.0)
MCV: 88 fL (ref 80–100)
Monocyte #: 2.2 x10 3/mm — ABNORMAL HIGH (ref 0.2–1.0)
Monocyte %: 11.5 %
NEUTROS ABS: 15.6 10*3/uL — AB (ref 1.4–6.5)
Neutrophil %: 80 %
PLATELETS: 197 10*3/uL (ref 150–440)
RBC: 3.38 10*6/uL — AB (ref 4.40–5.90)
RDW: 20.6 % — ABNORMAL HIGH (ref 11.5–14.5)
WBC: 19.5 10*3/uL — AB (ref 3.8–10.6)

## 2013-08-19 LAB — PROTIME-INR
INR: 1
Prothrombin Time: 12.6 secs (ref 11.5–14.7)

## 2013-08-20 LAB — CBC WITH DIFFERENTIAL/PLATELET
Bands: 3 %
HCT: 31.8 % — AB (ref 40.0–52.0)
HGB: 9.8 g/dL — ABNORMAL LOW (ref 13.0–18.0)
LYMPHS PCT: 14 %
MCH: 27.5 pg (ref 26.0–34.0)
MCHC: 30.8 g/dL — ABNORMAL LOW (ref 32.0–36.0)
MCV: 89 fL (ref 80–100)
Metamyelocyte: 4 %
Monocytes: 10 %
Myelocyte: 2 %
Platelet: 195 10*3/uL (ref 150–440)
RBC: 3.56 10*6/uL — AB (ref 4.40–5.90)
RDW: 21.5 % — ABNORMAL HIGH (ref 11.5–14.5)
Segmented Neutrophils: 67 %
WBC: 15.3 10*3/uL — ABNORMAL HIGH (ref 3.8–10.6)

## 2013-08-20 LAB — BASIC METABOLIC PANEL
ANION GAP: 5 — AB (ref 7–16)
BUN: 24 mg/dL — ABNORMAL HIGH (ref 7–18)
Calcium, Total: 8.4 mg/dL — ABNORMAL LOW (ref 8.5–10.1)
Chloride: 113 mmol/L — ABNORMAL HIGH (ref 98–107)
Co2: 27 mmol/L (ref 21–32)
Creatinine: 1.25 mg/dL (ref 0.60–1.30)
EGFR (African American): >60
EGFR (Non-African Amer.): 53 — ABNORMAL LOW
Glucose: 119 mg/dL — ABNORMAL HIGH (ref 65–99)
OSMOLALITY: 294 (ref 275–301)
Potassium: 4.3 mmol/L (ref 3.5–5.1)
Sodium: 145 mmol/L (ref 136–145)

## 2013-08-20 LAB — PROTIME-INR
INR: 1.5
Prothrombin Time: 18.1 secs — ABNORMAL HIGH (ref 11.5–14.7)

## 2013-08-21 LAB — BASIC METABOLIC PANEL
Anion Gap: 5 — ABNORMAL LOW (ref 7–16)
BUN: 18 mg/dL (ref 7–18)
CHLORIDE: 110 mmol/L — AB (ref 98–107)
CO2: 28 mmol/L (ref 21–32)
Calcium, Total: 8.6 mg/dL (ref 8.5–10.1)
Creatinine: 1.23 mg/dL (ref 0.60–1.30)
EGFR (African American): >60
EGFR (Non-African Amer.): 54 — ABNORMAL LOW
GLUCOSE: 109 mg/dL — AB (ref 65–99)
OSMOLALITY: 287 (ref 275–301)
Potassium: 4.1 mmol/L (ref 3.5–5.1)
Sodium: 143 mmol/L (ref 136–145)

## 2013-08-21 LAB — CBC WITH DIFFERENTIAL/PLATELET
Bands: 3 %
HCT: 32.5 % — AB (ref 40.0–52.0)
HGB: 10.2 g/dL — AB (ref 13.0–18.0)
LYMPHS PCT: 9 %
MCH: 27.8 pg (ref 26.0–34.0)
MCHC: 31.3 g/dL — AB (ref 32.0–36.0)
MCV: 89 fL (ref 80–100)
METAMYELOCYTE: 12 %
MONOS PCT: 8 %
MYELOCYTE: 2 %
PLATELETS: 209 10*3/uL (ref 150–440)
RBC: 3.66 10*6/uL — AB (ref 4.40–5.90)
RDW: 21 % — ABNORMAL HIGH (ref 11.5–14.5)
SEGMENTED NEUTROPHILS: 66 %
WBC: 16.8 10*3/uL — ABNORMAL HIGH (ref 3.8–10.6)

## 2013-08-21 LAB — PROTIME-INR
INR: 2.2
PROTHROMBIN TIME: 23.9 s — AB (ref 11.5–14.7)

## 2013-08-24 ENCOUNTER — Inpatient Hospital Stay: Payer: Self-pay | Admitting: Internal Medicine

## 2013-08-24 LAB — COMPREHENSIVE METABOLIC PANEL
Albumin: 1.9 g/dL — ABNORMAL LOW (ref 3.4–5.0)
Alkaline Phosphatase: 70 U/L
Anion Gap: 5 — ABNORMAL LOW (ref 7–16)
BUN: 23 mg/dL — ABNORMAL HIGH (ref 7–18)
Bilirubin,Total: 0.5 mg/dL (ref 0.2–1.0)
CHLORIDE: 108 mmol/L — AB (ref 98–107)
Calcium, Total: 7.9 mg/dL — ABNORMAL LOW (ref 8.5–10.1)
Co2: 26 mmol/L (ref 21–32)
Creatinine: 1.56 mg/dL — ABNORMAL HIGH (ref 0.60–1.30)
EGFR (African American): 47 — ABNORMAL LOW
GFR CALC NON AF AMER: 40 — AB
Glucose: 128 mg/dL — ABNORMAL HIGH (ref 65–99)
Osmolality: 283 (ref 275–301)
Potassium: 4.2 mmol/L (ref 3.5–5.1)
SGOT(AST): 23 U/L (ref 15–37)
SGPT (ALT): 21 U/L (ref 12–78)
Sodium: 139 mmol/L (ref 136–145)
Total Protein: 5.6 g/dL — ABNORMAL LOW (ref 6.4–8.2)

## 2013-08-24 LAB — TROPONIN I: Troponin-I: 0.07 ng/mL — ABNORMAL HIGH

## 2013-08-24 LAB — CK TOTAL AND CKMB (NOT AT ARMC)
CK, TOTAL: 27 U/L — AB
CK-MB: 1.1 ng/mL (ref 0.5–3.6)

## 2013-08-24 LAB — CBC
HCT: 27.7 % — ABNORMAL LOW (ref 40.0–52.0)
HGB: 8.7 g/dL — ABNORMAL LOW (ref 13.0–18.0)
MCH: 27.7 pg (ref 26.0–34.0)
MCHC: 31.5 g/dL — AB (ref 32.0–36.0)
MCV: 88 fL (ref 80–100)
Platelet: 214 10*3/uL (ref 150–440)
RBC: 3.15 10*6/uL — AB (ref 4.40–5.90)
RDW: 22.1 % — ABNORMAL HIGH (ref 11.5–14.5)
WBC: 30.1 10*3/uL — ABNORMAL HIGH (ref 3.8–10.6)

## 2013-08-24 LAB — PROTIME-INR
INR: 3.6
Prothrombin Time: 35.1 secs — ABNORMAL HIGH (ref 11.5–14.7)

## 2013-08-25 LAB — CBC WITH DIFFERENTIAL/PLATELET
Basophil #: 0.1 10*3/uL (ref 0.0–0.1)
Basophil %: 0.2 %
EOS PCT: 0 %
Eosinophil #: 0 10*3/uL (ref 0.0–0.7)
HCT: 26.2 % — ABNORMAL LOW (ref 40.0–52.0)
HGB: 8.2 g/dL — ABNORMAL LOW (ref 13.0–18.0)
Lymphocyte #: 0.9 10*3/uL — ABNORMAL LOW (ref 1.0–3.6)
Lymphocyte %: 2.5 %
MCH: 27.5 pg (ref 26.0–34.0)
MCHC: 31.2 g/dL — ABNORMAL LOW (ref 32.0–36.0)
MCV: 88 fL (ref 80–100)
Monocyte #: 1.6 x10 3/mm — ABNORMAL HIGH (ref 0.2–1.0)
Monocyte %: 4.4 %
NEUTROS ABS: 33.6 10*3/uL — AB (ref 1.4–6.5)
Neutrophil %: 92.9 %
Platelet: 209 10*3/uL (ref 150–440)
RBC: 2.97 10*6/uL — ABNORMAL LOW (ref 4.40–5.90)
RDW: 22.5 % — ABNORMAL HIGH (ref 11.5–14.5)
WBC: 36.2 10*3/uL — ABNORMAL HIGH (ref 3.8–10.6)

## 2013-08-25 LAB — PROTIME-INR
INR: 3.7
PROTHROMBIN TIME: 35.2 s — AB (ref 11.5–14.7)

## 2013-08-25 LAB — MAGNESIUM: Magnesium: 1.4 mg/dL — ABNORMAL LOW

## 2013-08-26 LAB — CBC WITH DIFFERENTIAL/PLATELET
Basophil #: 0.1 10*3/uL (ref 0.0–0.1)
Basophil %: 0.7 %
Eosinophil #: 0 10*3/uL (ref 0.0–0.7)
Eosinophil %: 0.1 %
HCT: 25.9 % — ABNORMAL LOW (ref 40.0–52.0)
HGB: 8.1 g/dL — ABNORMAL LOW (ref 13.0–18.0)
LYMPHS ABS: 1.1 10*3/uL (ref 1.0–3.6)
Lymphocyte %: 7.5 %
MCH: 27.7 pg (ref 26.0–34.0)
MCHC: 31.2 g/dL — AB (ref 32.0–36.0)
MCV: 89 fL (ref 80–100)
MONOS PCT: 9.3 %
Monocyte #: 1.4 x10 3/mm — ABNORMAL HIGH (ref 0.2–1.0)
NEUTROS PCT: 82.4 %
Neutrophil #: 12.3 10*3/uL — ABNORMAL HIGH (ref 1.4–6.5)
PLATELETS: 210 10*3/uL (ref 150–440)
RBC: 2.92 10*6/uL — AB (ref 4.40–5.90)
RDW: 23 % — AB (ref 11.5–14.5)
WBC: 14.9 10*3/uL — ABNORMAL HIGH (ref 3.8–10.6)

## 2013-08-26 LAB — PROTIME-INR
INR: 3.4
Prothrombin Time: 33.3 secs — ABNORMAL HIGH (ref 11.5–14.7)

## 2013-08-26 LAB — BASIC METABOLIC PANEL
ANION GAP: 6 — AB (ref 7–16)
BUN: 29 mg/dL — ABNORMAL HIGH (ref 7–18)
CALCIUM: 7.6 mg/dL — AB (ref 8.5–10.1)
CHLORIDE: 109 mmol/L — AB (ref 98–107)
Co2: 25 mmol/L (ref 21–32)
Creatinine: 1.58 mg/dL — ABNORMAL HIGH (ref 0.60–1.30)
EGFR (African American): 46 — ABNORMAL LOW
EGFR (Non-African Amer.): 40 — ABNORMAL LOW
GLUCOSE: 142 mg/dL — AB (ref 65–99)
Osmolality: 288 (ref 275–301)
POTASSIUM: 4 mmol/L (ref 3.5–5.1)
Sodium: 140 mmol/L (ref 136–145)

## 2013-08-27 LAB — BASIC METABOLIC PANEL
Anion Gap: 5 — ABNORMAL LOW (ref 7–16)
BUN: 27 mg/dL — AB (ref 7–18)
CREATININE: 1.43 mg/dL — AB (ref 0.60–1.30)
Calcium, Total: 7.8 mg/dL — ABNORMAL LOW (ref 8.5–10.1)
Chloride: 113 mmol/L — ABNORMAL HIGH (ref 98–107)
Co2: 23 mmol/L (ref 21–32)
GFR CALC AF AMER: 52 — AB
GFR CALC NON AF AMER: 45 — AB
Glucose: 116 mg/dL — ABNORMAL HIGH (ref 65–99)
Osmolality: 287 (ref 275–301)
POTASSIUM: 4.1 mmol/L (ref 3.5–5.1)
Sodium: 141 mmol/L (ref 136–145)

## 2013-08-27 LAB — CBC WITH DIFFERENTIAL/PLATELET
BASOS ABS: 1 %
Bands: 2 %
HCT: 26.1 % — ABNORMAL LOW (ref 40.0–52.0)
HGB: 8.3 g/dL — ABNORMAL LOW (ref 13.0–18.0)
Lymphocytes: 12 %
MCH: 28.3 pg (ref 26.0–34.0)
MCHC: 31.9 g/dL — AB (ref 32.0–36.0)
MCV: 89 fL (ref 80–100)
METAMYELOCYTE: 2 %
MONOS PCT: 14 %
PLATELETS: 238 10*3/uL (ref 150–440)
RBC: 2.95 10*6/uL — ABNORMAL LOW (ref 4.40–5.90)
RDW: 22.3 % — AB (ref 11.5–14.5)
SEGMENTED NEUTROPHILS: 69 %
WBC: 13.5 10*3/uL — ABNORMAL HIGH (ref 3.8–10.6)

## 2013-08-27 LAB — PROTIME-INR
INR: 3.4
Prothrombin Time: 33 secs — ABNORMAL HIGH (ref 11.5–14.7)

## 2013-08-28 LAB — CBC WITH DIFFERENTIAL/PLATELET
Basophil: 1 %
Comment - H1-Com2: NORMAL
HCT: 23.8 % — ABNORMAL LOW
HGB: 7.8 g/dL — ABNORMAL LOW
Lymphocytes: 14 %
MCH: 29 pg
MCHC: 32.5 g/dL
MCV: 89 fL
Monocytes: 4 %
Platelet: 243 10*3/uL
RBC: 2.67 x10 6/mm 3 — ABNORMAL LOW
RDW: 22.7 % — ABNORMAL HIGH
Segmented Neutrophils: 81 %
WBC: 10.5 10*3/uL

## 2013-08-28 LAB — PROTIME-INR
INR: 3.6
Prothrombin Time: 34.7 secs — ABNORMAL HIGH (ref 11.5–14.7)

## 2013-08-28 LAB — CREATININE, SERUM
Creatinine: 1.23 mg/dL
EGFR (African American): >60
EGFR (Non-African Amer.): 54 — ABNORMAL LOW

## 2013-08-29 LAB — CREATININE, SERUM
Creatinine: 1.18 mg/dL (ref 0.60–1.30)
EGFR (African American): >60
EGFR (Non-African Amer.): 56 — ABNORMAL LOW

## 2013-08-29 LAB — PROTIME-INR
INR: 2.8
Prothrombin Time: 28.9 secs — ABNORMAL HIGH (ref 11.5–14.7)

## 2013-08-29 LAB — CULTURE, BLOOD (SINGLE)

## 2013-08-29 LAB — HEMATOCRIT: HCT: 26.4 % — AB (ref 40.0–52.0)

## 2013-08-31 LAB — EXPECTORATED SPUTUM ASSESSMENT W REFEX TO RESP CULTURE

## 2013-09-04 ENCOUNTER — Ambulatory Visit: Payer: Self-pay | Admitting: Oncology

## 2013-09-08 ENCOUNTER — Ambulatory Visit: Payer: Self-pay | Admitting: Oncology

## 2013-09-21 ENCOUNTER — Observation Stay: Payer: Self-pay | Admitting: Internal Medicine

## 2013-09-21 LAB — COMPREHENSIVE METABOLIC PANEL
ALK PHOS: 76 U/L
AST: 27 U/L (ref 15–37)
Albumin: 2.5 g/dL — ABNORMAL LOW (ref 3.4–5.0)
Anion Gap: 4 — ABNORMAL LOW (ref 7–16)
BUN: 24 mg/dL — ABNORMAL HIGH (ref 7–18)
Bilirubin,Total: 0.5 mg/dL (ref 0.2–1.0)
CALCIUM: 8.4 mg/dL — AB (ref 8.5–10.1)
CHLORIDE: 102 mmol/L (ref 98–107)
CREATININE: 1.69 mg/dL — AB (ref 0.60–1.30)
Co2: 37 mmol/L — ABNORMAL HIGH (ref 21–32)
EGFR (African American): 42 — ABNORMAL LOW
GFR CALC NON AF AMER: 36 — AB
GLUCOSE: 100 mg/dL — AB (ref 65–99)
OSMOLALITY: 289 (ref 275–301)
Potassium: 3.4 mmol/L — ABNORMAL LOW (ref 3.5–5.1)
SGPT (ALT): 15 U/L (ref 12–78)
SODIUM: 143 mmol/L (ref 136–145)
TOTAL PROTEIN: 6.1 g/dL — AB (ref 6.4–8.2)

## 2013-09-21 LAB — URINALYSIS, COMPLETE
Bacteria: NONE SEEN
Bilirubin,UR: NEGATIVE
Blood: NEGATIVE
GLUCOSE, UR: NEGATIVE mg/dL (ref 0–75)
KETONE: NEGATIVE
Leukocyte Esterase: NEGATIVE
Nitrite: NEGATIVE
Ph: 6 (ref 4.5–8.0)
Protein: NEGATIVE
RBC,UR: NONE SEEN /HPF (ref 0–5)
Specific Gravity: 1.011 (ref 1.003–1.030)
Squamous Epithelial: <1
WBC UR: <1 /HPF (ref 0–5)

## 2013-09-21 LAB — APTT: ACTIVATED PTT: 43.1 s — AB (ref 23.6–35.9)

## 2013-09-21 LAB — PROTIME-INR
INR: 2.3
Prothrombin Time: 24.9 secs — ABNORMAL HIGH (ref 11.5–14.7)

## 2013-09-21 LAB — CBC
HCT: 33 % — ABNORMAL LOW (ref 40.0–52.0)
HGB: 10.5 g/dL — ABNORMAL LOW (ref 13.0–18.0)
MCH: 28.7 pg (ref 26.0–34.0)
MCHC: 31.9 g/dL — AB (ref 32.0–36.0)
MCV: 90 fL (ref 80–100)
PLATELETS: 147 10*3/uL — AB (ref 150–440)
RBC: 3.67 10*6/uL — ABNORMAL LOW (ref 4.40–5.90)
RDW: 21 % — AB (ref 11.5–14.5)
WBC: 7.2 10*3/uL (ref 3.8–10.6)

## 2013-09-21 LAB — HEMOGLOBIN: HGB: 10.6 g/dL — AB (ref 13.0–18.0)

## 2013-09-22 LAB — CBC WITH DIFFERENTIAL/PLATELET
BASOS ABS: 0.1 10*3/uL (ref 0.0–0.1)
Basophil %: 1.7 %
Eosinophil #: 0.4 10*3/uL (ref 0.0–0.7)
Eosinophil %: 6.6 %
HCT: 31 % — ABNORMAL LOW (ref 40.0–52.0)
HGB: 10.1 g/dL — ABNORMAL LOW (ref 13.0–18.0)
LYMPHS PCT: 13.9 %
Lymphocyte #: 0.9 10*3/uL — ABNORMAL LOW (ref 1.0–3.6)
MCH: 29.2 pg (ref 26.0–34.0)
MCHC: 32.6 g/dL (ref 32.0–36.0)
MCV: 90 fL (ref 80–100)
MONOS PCT: 10.7 %
Monocyte #: 0.7 x10 3/mm (ref 0.2–1.0)
NEUTROS ABS: 4.4 10*3/uL (ref 1.4–6.5)
Neutrophil %: 67.1 %
Platelet: 131 10*3/uL — ABNORMAL LOW (ref 150–440)
RBC: 3.46 10*6/uL — AB (ref 4.40–5.90)
RDW: 22 % — ABNORMAL HIGH (ref 11.5–14.5)
WBC: 6.6 10*3/uL (ref 3.8–10.6)

## 2013-09-22 LAB — BASIC METABOLIC PANEL
ANION GAP: 3 — AB (ref 7–16)
BUN: 21 mg/dL — ABNORMAL HIGH (ref 7–18)
CO2: 36 mmol/L — AB (ref 21–32)
CREATININE: 1.36 mg/dL — AB (ref 0.60–1.30)
Calcium, Total: 8.6 mg/dL (ref 8.5–10.1)
Chloride: 103 mmol/L (ref 98–107)
EGFR (African American): 55 — ABNORMAL LOW
GFR CALC NON AF AMER: 47 — AB
GLUCOSE: 98 mg/dL (ref 65–99)
OSMOLALITY: 286 (ref 275–301)
Potassium: 3.2 mmol/L — ABNORMAL LOW (ref 3.5–5.1)
SODIUM: 142 mmol/L (ref 136–145)

## 2013-09-22 LAB — MAGNESIUM: MAGNESIUM: 1.3 mg/dL — AB

## 2013-09-23 LAB — MAGNESIUM: Magnesium: 1.2 mg/dL — ABNORMAL LOW

## 2013-09-23 LAB — BASIC METABOLIC PANEL
Anion Gap: 5 — ABNORMAL LOW (ref 7–16)
BUN: 20 mg/dL — AB (ref 7–18)
CREATININE: 1.22 mg/dL (ref 0.60–1.30)
Calcium, Total: 8.4 mg/dL — ABNORMAL LOW (ref 8.5–10.1)
Chloride: 104 mmol/L (ref 98–107)
Co2: 35 mmol/L — ABNORMAL HIGH (ref 21–32)
EGFR (African American): >60
GFR CALC NON AF AMER: 54 — AB
Glucose: 83 mg/dL (ref 65–99)
Osmolality: 289 (ref 275–301)
Potassium: 3.5 mmol/L (ref 3.5–5.1)
Sodium: 144 mmol/L (ref 136–145)

## 2013-09-23 LAB — HEMOGLOBIN: HGB: 10 g/dL — ABNORMAL LOW (ref 13.0–18.0)

## 2013-09-23 LAB — HEMATOCRIT: HCT: 32.5 % — ABNORMAL LOW (ref 40.0–52.0)

## 2013-09-24 LAB — BASIC METABOLIC PANEL
Anion Gap: 7 (ref 7–16)
BUN: 19 mg/dL — ABNORMAL HIGH (ref 7–18)
CO2: 33 mmol/L — AB (ref 21–32)
Calcium, Total: 8.6 mg/dL (ref 8.5–10.1)
Chloride: 106 mmol/L (ref 98–107)
Creatinine: 1.32 mg/dL — ABNORMAL HIGH (ref 0.60–1.30)
EGFR (African American): 57 — ABNORMAL LOW
GFR CALC NON AF AMER: 49 — AB
GLUCOSE: 84 mg/dL (ref 65–99)
Osmolality: 292 (ref 275–301)
Potassium: 3.4 mmol/L — ABNORMAL LOW (ref 3.5–5.1)
SODIUM: 146 mmol/L — AB (ref 136–145)

## 2013-09-24 LAB — CBC WITH DIFFERENTIAL/PLATELET
Basophil #: 0.1 10*3/uL (ref 0.0–0.1)
Basophil %: 1.3 %
EOS ABS: 0.4 10*3/uL (ref 0.0–0.7)
Eosinophil %: 6.4 %
HCT: 31.2 % — AB (ref 40.0–52.0)
HGB: 9.8 g/dL — ABNORMAL LOW (ref 13.0–18.0)
LYMPHS ABS: 0.8 10*3/uL — AB (ref 1.0–3.6)
Lymphocyte %: 14.3 %
MCH: 28.6 pg (ref 26.0–34.0)
MCHC: 31.3 g/dL — AB (ref 32.0–36.0)
MCV: 91 fL (ref 80–100)
Monocyte #: 0.7 x10 3/mm (ref 0.2–1.0)
Monocyte %: 12.6 %
NEUTROS ABS: 3.8 10*3/uL (ref 1.4–6.5)
NEUTROS PCT: 65.4 %
Platelet: 142 10*3/uL — ABNORMAL LOW (ref 150–440)
RBC: 3.41 10*6/uL — ABNORMAL LOW (ref 4.40–5.90)
RDW: 21.5 % — ABNORMAL HIGH (ref 11.5–14.5)
WBC: 5.9 10*3/uL (ref 3.8–10.6)

## 2013-09-24 LAB — MAGNESIUM: Magnesium: 1.6 mg/dL — ABNORMAL LOW

## 2013-09-25 LAB — BASIC METABOLIC PANEL
Anion Gap: 6 — ABNORMAL LOW (ref 7–16)
BUN: 16 mg/dL (ref 7–18)
Calcium, Total: 8.7 mg/dL (ref 8.5–10.1)
Chloride: 104 mmol/L (ref 98–107)
Co2: 34 mmol/L — ABNORMAL HIGH (ref 21–32)
Creatinine: 1.2 mg/dL (ref 0.60–1.30)
GFR CALC NON AF AMER: 55 — AB
Glucose: 93 mg/dL (ref 65–99)
Osmolality: 288 (ref 275–301)
Potassium: 3.7 mmol/L (ref 3.5–5.1)
SODIUM: 144 mmol/L (ref 136–145)

## 2013-09-25 LAB — HEMATOCRIT: HCT: 30.7 % — ABNORMAL LOW (ref 40.0–52.0)

## 2013-09-26 LAB — CBC WITH DIFFERENTIAL/PLATELET
BASOS ABS: 0.1 10*3/uL (ref 0.0–0.1)
Basophil %: 2.1 %
Eosinophil #: 0.3 10*3/uL (ref 0.0–0.7)
Eosinophil %: 5.2 %
HCT: 31.3 % — AB (ref 40.0–52.0)
HGB: 9.8 g/dL — ABNORMAL LOW (ref 13.0–18.0)
Lymphocyte #: 1.3 10*3/uL (ref 1.0–3.6)
Lymphocyte %: 23.1 %
MCH: 28.6 pg (ref 26.0–34.0)
MCHC: 31.4 g/dL — ABNORMAL LOW (ref 32.0–36.0)
MCV: 91 fL (ref 80–100)
MONO ABS: 0.6 x10 3/mm (ref 0.2–1.0)
MONOS PCT: 10.6 %
NEUTROS ABS: 3.2 10*3/uL (ref 1.4–6.5)
NEUTROS PCT: 59 %
Platelet: 149 10*3/uL — ABNORMAL LOW (ref 150–440)
RBC: 3.44 10*6/uL — ABNORMAL LOW (ref 4.40–5.90)
RDW: 21.6 % — ABNORMAL HIGH (ref 11.5–14.5)
WBC: 5.5 10*3/uL (ref 3.8–10.6)

## 2013-09-26 LAB — BASIC METABOLIC PANEL
Anion Gap: 1 — ABNORMAL LOW (ref 7–16)
BUN: 13 mg/dL (ref 7–18)
CALCIUM: 8.7 mg/dL (ref 8.5–10.1)
CHLORIDE: 106 mmol/L (ref 98–107)
CO2: 37 mmol/L — AB (ref 21–32)
CREATININE: 1.26 mg/dL (ref 0.60–1.30)
EGFR (Non-African Amer.): 52 — ABNORMAL LOW
GFR CALC AF AMER: 60 — AB
Glucose: 111 mg/dL — ABNORMAL HIGH (ref 65–99)
Osmolality: 288 (ref 275–301)
Potassium: 3.8 mmol/L (ref 3.5–5.1)
SODIUM: 144 mmol/L (ref 136–145)

## 2013-09-27 LAB — MAGNESIUM: MAGNESIUM: 1.5 mg/dL — AB

## 2013-09-27 LAB — BASIC METABOLIC PANEL
Anion Gap: 1 — ABNORMAL LOW (ref 7–16)
BUN: 14 mg/dL (ref 7–18)
Calcium, Total: 8.4 mg/dL — ABNORMAL LOW (ref 8.5–10.1)
Chloride: 107 mmol/L (ref 98–107)
Co2: 35 mmol/L — ABNORMAL HIGH (ref 21–32)
Creatinine: 1.28 mg/dL (ref 0.60–1.30)
GFR CALC AF AMER: 59 — AB
GFR CALC NON AF AMER: 51 — AB
GLUCOSE: 78 mg/dL (ref 65–99)
Osmolality: 284 (ref 275–301)
POTASSIUM: 3.7 mmol/L (ref 3.5–5.1)
SODIUM: 143 mmol/L (ref 136–145)

## 2013-09-27 LAB — CBC WITH DIFFERENTIAL/PLATELET
BASOS PCT: 2.4 %
Basophil #: 0.1 10*3/uL (ref 0.0–0.1)
Eosinophil #: 0.3 10*3/uL (ref 0.0–0.7)
Eosinophil %: 4.5 %
HCT: 30.2 % — ABNORMAL LOW (ref 40.0–52.0)
HGB: 9.4 g/dL — AB (ref 13.0–18.0)
LYMPHS PCT: 23.8 %
Lymphocyte #: 1.3 10*3/uL (ref 1.0–3.6)
MCH: 28.4 pg (ref 26.0–34.0)
MCHC: 31.1 g/dL — AB (ref 32.0–36.0)
MCV: 91 fL (ref 80–100)
MONOS PCT: 11.2 %
Monocyte #: 0.6 x10 3/mm (ref 0.2–1.0)
NEUTROS PCT: 58.1 %
Neutrophil #: 3.3 10*3/uL (ref 1.4–6.5)
PLATELETS: 154 10*3/uL (ref 150–440)
RBC: 3.32 10*6/uL — ABNORMAL LOW (ref 4.40–5.90)
RDW: 21 % — AB (ref 11.5–14.5)
WBC: 5.6 10*3/uL (ref 3.8–10.6)

## 2013-09-28 LAB — BASIC METABOLIC PANEL
Anion Gap: 1 — ABNORMAL LOW (ref 7–16)
BUN: 14 mg/dL (ref 7–18)
CO2: 36 mmol/L — AB (ref 21–32)
CREATININE: 1.35 mg/dL — AB (ref 0.60–1.30)
Calcium, Total: 8.9 mg/dL (ref 8.5–10.1)
Chloride: 105 mmol/L (ref 98–107)
EGFR (Non-African Amer.): 48 — ABNORMAL LOW
GFR CALC AF AMER: 55 — AB
Glucose: 87 mg/dL (ref 65–99)
OSMOLALITY: 283 (ref 275–301)
Potassium: 3.7 mmol/L (ref 3.5–5.1)
SODIUM: 142 mmol/L (ref 136–145)

## 2013-09-28 LAB — CBC WITH DIFFERENTIAL/PLATELET
Basophil #: 0.1 10*3/uL (ref 0.0–0.1)
Basophil %: 2.6 %
EOS ABS: 0.3 10*3/uL (ref 0.0–0.7)
Eosinophil %: 5.3 %
HCT: 31 % — ABNORMAL LOW (ref 40.0–52.0)
HGB: 9.8 g/dL — ABNORMAL LOW (ref 13.0–18.0)
Lymphocyte #: 1.4 10*3/uL (ref 1.0–3.6)
Lymphocyte %: 24.5 %
MCH: 28.9 pg (ref 26.0–34.0)
MCHC: 31.6 g/dL — AB (ref 32.0–36.0)
MCV: 91 fL (ref 80–100)
MONOS PCT: 11.2 %
Monocyte #: 0.6 x10 3/mm (ref 0.2–1.0)
NEUTROS PCT: 56.4 %
Neutrophil #: 3.1 10*3/uL (ref 1.4–6.5)
Platelet: 147 10*3/uL — ABNORMAL LOW (ref 150–440)
RBC: 3.39 10*6/uL — AB (ref 4.40–5.90)
RDW: 21.1 % — ABNORMAL HIGH (ref 11.5–14.5)
WBC: 5.6 10*3/uL (ref 3.8–10.6)

## 2013-09-30 ENCOUNTER — Observation Stay: Payer: Self-pay | Admitting: Internal Medicine

## 2013-09-30 LAB — COMPREHENSIVE METABOLIC PANEL
ALT: 17 U/L (ref 12–78)
AST: 26 U/L (ref 15–37)
Albumin: 2.6 g/dL — ABNORMAL LOW (ref 3.4–5.0)
Alkaline Phosphatase: 77 U/L
Anion Gap: 5 — ABNORMAL LOW (ref 7–16)
BILIRUBIN TOTAL: 0.4 mg/dL (ref 0.2–1.0)
BUN: 15 mg/dL (ref 7–18)
CHLORIDE: 102 mmol/L (ref 98–107)
CO2: 31 mmol/L (ref 21–32)
Calcium, Total: 8.9 mg/dL (ref 8.5–10.1)
Creatinine: 1.32 mg/dL — ABNORMAL HIGH (ref 0.60–1.30)
EGFR (Non-African Amer.): 49 — ABNORMAL LOW
GFR CALC AF AMER: 57 — AB
GLUCOSE: 115 mg/dL — AB (ref 65–99)
OSMOLALITY: 277 (ref 275–301)
POTASSIUM: 4 mmol/L (ref 3.5–5.1)
Sodium: 138 mmol/L (ref 136–145)
Total Protein: 6.3 g/dL — ABNORMAL LOW (ref 6.4–8.2)

## 2013-09-30 LAB — URINALYSIS, COMPLETE
BLOOD: NEGATIVE
Bacteria: NONE SEEN
Bilirubin,UR: NEGATIVE
Glucose,UR: NEGATIVE mg/dL (ref 0–75)
Ketone: NEGATIVE
Nitrite: NEGATIVE
PH: 6 (ref 4.5–8.0)
Protein: NEGATIVE
Specific Gravity: 1.023 (ref 1.003–1.030)

## 2013-09-30 LAB — CBC
HCT: 33.8 % — ABNORMAL LOW (ref 40.0–52.0)
HGB: 10.6 g/dL — ABNORMAL LOW (ref 13.0–18.0)
MCH: 28.8 pg (ref 26.0–34.0)
MCHC: 31.5 g/dL — ABNORMAL LOW (ref 32.0–36.0)
MCV: 92 fL (ref 80–100)
PLATELETS: 148 10*3/uL — AB (ref 150–440)
RBC: 3.69 10*6/uL — AB (ref 4.40–5.90)
RDW: 20.8 % — AB (ref 11.5–14.5)
WBC: 5.7 10*3/uL (ref 3.8–10.6)

## 2013-09-30 LAB — PROTIME-INR
INR: 1
Prothrombin Time: 13.4 secs (ref 11.5–14.7)

## 2013-09-30 LAB — APTT: Activated PTT: 33.4 secs (ref 23.6–35.9)

## 2013-10-01 LAB — BASIC METABOLIC PANEL
Anion Gap: 6 — ABNORMAL LOW (ref 7–16)
BUN: 15 mg/dL (ref 7–18)
CALCIUM: 8.7 mg/dL (ref 8.5–10.1)
CHLORIDE: 103 mmol/L (ref 98–107)
Co2: 32 mmol/L (ref 21–32)
Creatinine: 1.27 mg/dL (ref 0.60–1.30)
EGFR (African American): 59 — ABNORMAL LOW
EGFR (Non-African Amer.): 51 — ABNORMAL LOW
GLUCOSE: 91 mg/dL (ref 65–99)
OSMOLALITY: 282 (ref 275–301)
POTASSIUM: 4 mmol/L (ref 3.5–5.1)
Sodium: 141 mmol/L (ref 136–145)

## 2013-10-01 LAB — CBC WITH DIFFERENTIAL/PLATELET
BASOS ABS: 0.1 10*3/uL (ref 0.0–0.1)
Basophil %: 1 %
Eosinophil #: 0.3 10*3/uL (ref 0.0–0.7)
Eosinophil %: 4.7 %
HCT: 32.9 % — AB (ref 40.0–52.0)
HGB: 10.4 g/dL — ABNORMAL LOW (ref 13.0–18.0)
LYMPHS PCT: 19 %
Lymphocyte #: 1.1 10*3/uL (ref 1.0–3.6)
MCH: 28.8 pg (ref 26.0–34.0)
MCHC: 31.5 g/dL — ABNORMAL LOW (ref 32.0–36.0)
MCV: 92 fL (ref 80–100)
Monocyte #: 0.7 x10 3/mm (ref 0.2–1.0)
Monocyte %: 12.8 %
Neutrophil #: 3.5 10*3/uL (ref 1.4–6.5)
Neutrophil %: 62.5 %
PLATELETS: 142 10*3/uL — AB (ref 150–440)
RBC: 3.59 10*6/uL — AB (ref 4.40–5.90)
RDW: 22.1 % — AB (ref 11.5–14.5)
WBC: 5.6 10*3/uL (ref 3.8–10.6)

## 2013-10-02 LAB — HEMOGLOBIN
HGB: 10.3 g/dL — AB (ref 13.0–18.0)
HGB: 9.9 g/dL — ABNORMAL LOW (ref 13.0–18.0)

## 2013-10-05 ENCOUNTER — Ambulatory Visit: Payer: Self-pay | Admitting: Oncology

## 2013-10-05 LAB — CULTURE, BLOOD (SINGLE)

## 2013-10-09 ENCOUNTER — Ambulatory Visit: Payer: Self-pay | Admitting: Oncology

## 2013-11-04 DEATH — deceased

## 2014-08-27 NOTE — Consult Note (Signed)
Brief Consult Note: Diagnosis: Hx esophageal Ca, asp pneumonitis, Ba pill won't pass.   Patient was seen by consultant.   Consult note dictated.   Comments: Chad Luna is a pleasant 81 y/o male with hx recurrent esophageal adenocarcinoma & congestive heart failure.  He was admitted with acute on chronic respiratory failure due to COPD/CHF, & aspiration pneumonitis.  There are two concerns, one is whether bilat pleural effusions are related to aspiraiton vs. CHF & second, whether distal esophagus can be dilated since Ba pill will not pass.  I have discussed this case with both Dr Danne Norris & Dr Caryl Comes.  EGD with possible esophageal dilation if no bulky tumor is found.  Discussed w/ pt & daughter who both agree.  Plan: 1) Agree w/PPI 2) NPO after MN 3) EGD with possible esophageal dilation tomorrow 4) Further recommendations pending EGD 5) Suspend lovenox for EGD  Thanks for consult.  Please see full dictated note. #[id number].  Electronic Signatures: Chad Luna (NP)  (Signed 01-Jul-14 16:38)  Authored: Brief Consult Note   Last Updated: 01-Jul-14 16:38 by Chad Luna (NP)

## 2014-08-27 NOTE — Discharge Summary (Signed)
PATIENT NAME:  Chad Luna, Chad Luna MR#:  [id number] DATE OF BIRTH:  July 03, 1926  DATE OF ADMISSION:  10/31/2012 DATE OF DISCHARGE:  11/06/2012  FINAL DIAGNOSES: 1.  Acute on chronic left-sided diastolic congestive heart failure.  2.  Acute on chronic respiratory failure secondary to #1.  3.  Evidence of aspiration pneumonitis.  4.  Esophageal cancer.  5.  Candida esophagitis.  6.  Esophageal stricture secondary to esophagitis and esophageal cancer.  7.  Chronic obstructive pulmonary disease with acute exacerbation.  8.  Anemia.  9.  Sleep apnea.  10.  Hypertension.  11.  Peptic ulcer disease.  12.  Peripheral vascular disease.  13.  History of bladder cancer.  14.  Osteoarthritis.  15.  Coronary artery disease.  76.  Adult onset diabetes mellitus, diet controlled.  17.  History of elevated PSA.  18.  History of fall with left femur and right radius fractures.   HISTORY AND PHYSICAL:  Please see dictated admission history and physical.   HOSPITAL COURSE:  The patient the patient was admitted with marked hypoxia and acute on chronic respiratory failure. Initial chest x-ray revealed evidence of right pleural effusion and pulmonary edema. It was worrisome, however, that patient might actually have some aspiration pneumonitis. Initially underwent CT scan without contrast secondary to chronic kidney disease, stage III; this showed small pleural effusion, no evidence of enough fluid to tap. He had gotten a d-dimer performed, which was positive, so he was hydrated and underwent CT of the chest with contrast with no evidence of pulmonary embolism. Repeat chest CT with contrast after hydration showed a development of left pleural effusion and edema. He was placed on diuretics for presumed worsening heart failure. Echocardiogram was performed, which revealed diastolic dysfunction. He had been placed empirically on antibiotics, as well, as there was concern about aspiration given his history of esophageal  cancer. GI was consulted after a barium swallow was performed, which revealed that the barium tablet would not pass. He underwent endoscopy which confirmed esophageal cancer and showed evidence of Candida esophagitis. Dilation was performed. There was minimal bleeding with the dilation per report and recommendation was to monitor him overnight which was performed.   He did well following this. Really did not have a lot of dysphagia. His breathing has improved, though he continues to require oxygen for oxygen saturation to 85 to 86% on room air. He has been noncompliant with this in the past and reports of importance of continued use was stressed to the patient. He was continued on diuretics. Low dose ACE inhibitor was added and he tolerated this. Beta blockers are being avoided secondary to his COPD and his requirement for Cardizem.   Physical therapy ambulated the patient. He went 225 feet. They initially recommended skilled nursing; however, patient did not wish to pursue this and appeared physically to be doing well enough to be able to return home. He is aware of potential risks of this, but wishes to do so. The patient will, therefore, be discharged to home in stable condition. His physical activity up as tolerated with a walker. He will be on oxygen 2 liters nasal cannula continuously. He should weigh himself daily, calling for more than 2-pound gain in 1 day or 5 pounds in 1 week or increasing signs or symptoms of heart failure to include weight gain, fatigue, edema, dyspnea, changes in urination, and he is familiar with these symptoms. I will anticipate him following up in our office in the next 1 to  2 weeks and then returning to GI shortly afterward, with a decision on repeat evaluation to be left to GI. He will follow a 2 gram sodium diet. Home health nursing and physical therapy was ordered for the patient.   DISCHARGE MEDICATIONS: 1.  Allopurinol 100 mg p.o. daily.  2.  Vitamin B12 500 mcg p.o.  daily.  3.  Furosemide 40 mg p.o. daily.  4.  Combivent Respimat 1 puff 4 times a day.  5.  Aspirin 81 mg p.o. daily.  6.  Nexium 40 mg p.o. daily.  7.  Colace 100 mg p.o. daily.  8.  Tamsulosin 0.4 mg p.o. at bedtime.  9.  Vitamin D 2000 units p.o. daily.  10.  Potassium 10 mEq p.o. daily.  11.  Iron sulfate 325 mg p.o. daily for his anemia.  12.  Prednisone taper starting at 30 mg, decreasing by 10 mg every 2 days until done.  13.  Lisinopril 5 mg p.o. daily.  14.  Fluconazole 100 mg p.o. daily x 14 days.  15.  Symbicort 160/4.5, two puffs b.i.d.  16.  Cardizem CD 240 mg p.o. daily.  17.  Ceftin 500 mg p.o. b.i.d. x 10 days.   He was given instructions to hold Asmanex and hold lovastatin. We will anticipate restarting the lovastatin once he comes off of the fluconazole.   CODE STATUS:  The patient is DO NOT RESUSCITATE.   ____________________________ Adin Hector, MD bjk:kc D: 11/06/2012 07:58:00 ET T: 11/06/2012 08:14:05 ET JOB#: [id number]  cc: Adin Hector, MD, <Dictator> Ramonita Lab MD ELECTRONICALLY SIGNED 11/08/2012 7:46

## 2014-08-27 NOTE — Consult Note (Signed)
PATIENT NAME:  Chad Luna, Chad Luna MR#:  [id number] DATE OF BIRTH:  [birth year]  DATE OF ADMISSION:  02/20/2013 DATE OF CONSULTATION:  02/20/2013  REFERRING PHYSICIAN:  Dr. Lunette Stands CONSULTING PHYSICIAN:  Theodore Demark, NP  Consult was ordered by Dr. Lunette Stands to evaluate vomiting, history of esophageal cancer.     Appreciate consult for 81 year old Caucasian man with history of esophageal cancer, followed by Dr. Grayland Ormond, for evaluation of dysphasia and vomiting. He states he was doing well when he saw Dr. Grayland Ormond last in September 2014. Since then, he developed intermittent dysphagia and odynophagia over the last couple of weeks with vomiting, and this increased in severity yesterday. States anything he swallows, except for medications, gets stuck in his mid chest and then he regurgitates. Had EGD 03/14 with findings of esophageal adenocarcinoma. EGD repeated for dysphagia in July 2014, by Dr. Hudson Norris, with no adenocarcinoma at the time. States he cannot remember his last chemotherapy treatment. States it has been "a while." Denies all other GI complaints presently.   PAST MEDICAL HISTORY:  1.  COPD.  2.  Esophageal cancer status post radiation and chemotherapy.  3. Coronary artery disease status post stents.  4.  Chronic anemia.  5.  Hypertension.  6.  Perforated duodenal ulcer requiring exploratory lap and repair 10 to 12 years ago.  7.  Bladder cancer requiring multiple treatments.  8.  History of fall with left femur, right radial fractures status post ORIF. The right radius with closed reduction.   ALLERGIES:  ERBITUX.   HOME MEDICATIONS:  Vitamin D3, 2000 units daily; vitamin B12, 500 mcg once a day; tamsulosin 0.4 mg daily, potassium chloride 10 mEq once a day, Nexium 40 mg once a day, Lasix 40 once a day, Ferrous sulfate once a day, docusate sodium 100 mg p.o. daily, Cardizem 240 mg, 1 capsule once a day, Combivent 1 puff 4 times a day p.r.n.,  aspirin 81 mg p.o. daily, allopurinol  100 mg once a day.   SOCIAL HISTORY: Quit smoking in 2000. No EtOH. Lives alone.   FAMILY HISTORY: No significant GI family history, history of prostate cancer.   REVIEW OF SYSTEMS: Ten systems reviewed, significant for generalized weakness and hardness of hearing. Some arthralgias and myalgias.   LABORATORY DATA: Most recent, glucose 130, BUN 18, creatinine 1.41, sodium 140, potassium 4.0, GFR 53, calcium 9.5, magnesium 1.1, lipase 227, total protein 7.3, albumin 3.4, total bilirubin 0.6, ALP 119, AST 34, ALT 29. Troponin 0.02. WBC 8.8, hemoglobin 15.6, hematocrit 45.6, platelet count 186, MCV 91, MCH 31.1, RDW 17.3.  No growth in blood cultures from 16 October.   PHYSICAL EXAMINATION: VITAL SIGNS: Most recent, temp 98.5, pulse 99, respiratory rate 20, blood pressure 137/82, SaO2 98% on 4 liters oxygen.  GENERAL: Well-built, well-nourished pleasant man lying in bed in no acute distress.  HEENT: Normocephalic, atraumatic. Sclerae clear. Conjunctivae pink. Mucous membranes pink and moist.  NECK: Supple. No JVD, thyromegaly, lymphadenopathy.  CHEST:  S1, S2. RRR. No MRG.  LUNGS: Clear.  Respirations eupneic.  ABDOMEN: Flat, soft. Bowel sounds x 4. Nondistended, nontender. No guarding, rigidity, peritoneal signs, hepatosplenomegaly or other abnormalities.  SKIN: Warm, dry, pink. No erythema, lesion or rash.  EXTREMITIES: MAEW x 4. Strength 5/5.  NEUROLOGIC: Alert, oriented x 3. Cranial nerves II through XII intact. Speech clear. No facial droop.  PSYCHIATRIC: Pleasant, calm, cooperative.   IMPRESSION AND PLAN: Dysphagia, odynophagia with vomiting, concerning for recurrence of adenocarcinoma. Plan for EGD when clinically feasible.  Thank you very much for this consult. These services were provided by Stephens November, MSN, [facility], in collaboration with Dr. Lollie Sails, M.D., with whom I have discussed this patient in full.   ____________________________ Theodore Demark,  NP chl:dmm D: 02/20/2013 19:08:05 ET T: 02/20/2013 19:36:15 ET JOB#: [id number]  cc: Theodore Demark, NP, <Dictator> [place] SIGNED 02/25/2013 14:01

## 2014-08-27 NOTE — H&P (Signed)
PATIENT NAME:  Chad Luna, Chad Luna MR#:  [id number] DATE OF BIRTH:  [birth year]  DATE OF ADMISSION:  10/31/2012  CHIEF COMPLAINT: Shortness of breath.   HISTORY OF PRESENT ILLNESS: The patient is an 81 year old male with history of COPD, sleep apnea, hypertension, peripheral vascular disease and esophageal cancer for which he has received chemotherapy and recurrent radiation therapy secondary to persistent disease. In addition, he has coronary artery disease and diabetes. He was seen earlier this week or increasing edema and shortness of breath. Evaluation at that time revealed significant peripheral edema, reduced oxygen saturation at rest and chest x-ray was obtained, which appeared to appear to reveal some right pulmonary edema, right side pleural effusion and possible hilar adenopathy. Diuretics were adjusted. He has lost about 6 pounds over the last several days and came in for recheck. Clinically, he feels better, but his saturations are actually down to 82% on room air now. He is admitted now with acute respiratory failure, likely secondary to acute on chronic left-sided systolic congestive heart failure; however, the possibility of aspiration pneumonitis, malignant pleural effusion, or underlying pneumonia cannot be completely excluded. He has not had any fevers or chills. He is not really coughing up any material. He feels more short of breath when lying flat.   PAST MEDICAL HISTORY:  1.  COPD.  2.  Anemia.  3.  Sleep apnea.  4.  Hypertension.  5.  Duodenal ulcer with prior perforation requiring repair.  6.  History of abdominal aortic aneurysm with prior stent graft.  7.  History of bladder cancer with prior multiple treatments.  8.  History of degenerative joint disease and degenerative disk disease.  9.  History of moderate aortic insufficiency by prior echocardiogram.  10. History of esophageal adenocarcinoma, T3, NX, M0 at diagnosis. Status post radiation therapy and chemotherapy with  multiple re-treatments.  11. Coronary artery disease, status post Vision stents to the mid LAD in 2009.  12. Diabetes mellitus.  13. History of elevated PSA.  14. History of fall with left femur and right radius fracture, status post ORIF of the left hip.   ALLERGIES: UNKNOWN CHEMO MEDICATION CAUSED ITCHING.   MEDICATIONS:  1.  Nexium 40 mg p.o. daily.  2.  Lovastatin 40 mg at bedtime.  3.  Aspirin 81 mg p.o. daily. 4.  Vitamin D 4000 units p.o. daily.  5.  Allopurinol 100 mg p.o. daily.  6.  Colace 100 mg p.o. daily.  7.  Combivent Respimat 1 puff 4 times a day.  8.  Asmanex 110 mcg MDI 1 puff b.i.d.  9.  Vitamin B12 1000 mcg p.o. daily.  10. Flonase 2 sprays each nostril daily.  11. Flomax 0.4 mg p.o. at bedtime.  12. Cardizem CD 240 mg p.o. daily. 13. Lasix 40 mg p.o. daily, recently increased to 80 mg.  14. Potassium 10 mEq p.o. daily.   SOCIAL HISTORY: Remote tobacco. No alcohol.   FAMILY HISTORY: Prostate cancer.   REVIEW OF SYSTEMS: Please see HPI. Denies fevers or chills. Occasional trouble swallowing, but not a regular issue. No changes in bowels or bladder. Remainder of complete review of systems is negative.   PHYSICAL EXAMINATION:  VITAL SIGNS: Weight 207, blood pressure 108/64, pulse 88, saturation 82% on room air, increasing to 92% on 3 L.  GENERAL: Elderly male, appears ill.  EYES: Pupils are ound and reactive to light. Lids and conjunctivae unremarkable.  EARS, NOSE, AND THROAT: External examination is unremarkable. Oropharynx is crowded without lesions.  NECK:  Trachea midline. No thyromegaly.  CARDIOVASCULAR: Regular rate and rhythm. Somewhat distant without gallops or rubs. 2/6 systolic murmur. Carotid and radial pulses 2+.  LUNGS: Crackles are heard more the right than the left with decreased air flow into the right base.  ABDOMEN: Soft, distended, quiet bowel sounds without guarding or rebound.  SKIN: No significant rashes or nodules.  LYMPH NODES: No  cervical or supraclavicular nodes.  MUSCULOSKELETAL: No clubbing or cyanosis. 1+ edema in the bilateral lower extremities with distal hair loss.  NEUROLOGIC: Cranial nerves are intact except for some mild hearing loss. Motor strength appears to be grossly symmetrical.   IMPRESSION AND PLAN:  1.  Congestive heart failure/respiratory failure. Continue to try to diurese as able, watching for hypotension. Reduce Cardizem dose for now. We will repeat echocardiogram with consideration for change over to beta blocker. Initiate treatment for his chronic obstructive pulmonary disease component; however, again, this appears to be more consistent with pulmonary edema. CT chest with contrast to evaluate, what looks like, hilar adenopathy, evaluate for any evidence of metastatic disease, as well as for any underlying pneumonia. Hold antibiotics for now given the absence of fevers and chills. Continue on inhaled medications and SVNs for his chronic obstructive pulmonary disease.  2.  Hypertension as noted above, blood pressure is actually slightly low. Adjusting medications and following closely.  3.  Diabetes mellitus. Place on sliding scale.    ____________________________ Adin Hector, MD bjk:aw D: 10/31/2012 13:05:48 ET T: 10/31/2012 13:25:49 ET JOB#: [id number]  cc: Tama High III, MD, <Dictator> Ramonita Lab MD ELECTRONICALLY SIGNED 11/08/2012 7:46

## 2014-08-27 NOTE — Consult Note (Signed)
PATIENT NAME:  Chad, Luna MR#:  [id number] DATE OF BIRTH:  03/22/1927  DATE OF CONSULTATION:  11/04/2012  REFERRING PHYSICIAN:  Ramonita Lab, MD CONSULTING PHYSICIAN:  Lucilla Lame, MD/Chad Luna Evalina Field, NP  PRIMARY GASTROENTEROLOGIST:  Keith Rake, MD   REASON FOR CONSULTATION: Esophageal carcinoma, aspiration pneumonitis and barium pill will not pass.   HISTORY OF PRESENT ILLNESS: Chad Luna is a pleasant 81 year old male with a history of recurrent esophageal adenocarcinoma. He reports that if he takes small bites of food, he does not have any problem swallowing. He denies any odynophagia. He denies any regurgitation. He does note few problems if he eats too quickly. He had a barium pill esophagram, which showed a 12 mm barium pill did not pass distal esophagus. There was no evidence of aspiration, changes of presbyesophagus and a distal esophageal stricture. He was admitted with acute on chronic respiratory failure with congestive heart failure, chronic obstructive pulmonary disease and aspiration pneumonitis. He initially he has been shown to have a right and left pleural effusions on recent CT imaging. His white blood cell count is 17.5. It was normal on June 28. His hemoglobin is 11.5. His last EGD by Dr. Vira Agar was 07/17/2012 where he was found to have adenocarcinoma at 82 to 40 cm to 44 to 45 cm. He had a fungating mass in the distal one third of the esophagus, diffuse gastritis and was H. pylori negative. He has a PET scan scheduled for September 11. He reports 10 pounds weight loss in the last couple months.   PAST MEDICAL AND SURGICAL HISTORY: Esophageal adenocarcinoma status post chemotherapy radiation initially [id number] tumor, duodenal ulcer with perforation and surgical repair, diastolic congestive heart failure, abdominal aortic aneurysm with stent and graft, aortic insufficiency, chronic obstructive pulmonary disease, and sleep apnea, hypertension, peripheral vascular disease degenerative  disk disease, degenerative joint disease and coronary artery disease, elevated PSA, diabetes mellitus, bladder cancer left femur, right radial and left hip fracture, abdominal  ventral hernia repair.   MEDICATIONS PRIOR TO ADMISSION: Allopurinol 100 mg daily, Avonex Twisthaler 110 mcg 2 times daily, aspirin 81 mg at bedtime, Combivent 100/20 mcg q.i.d. p.r.n., diltiazem  240 mg extended release at bedtime, docusate 100 mg at bedtime, ferrous sulfate 325 mg daily, furosemide 40 mg daily, lovastatin 40 mg daily, Nexium 40 mg daily, potassium chloride 10 mEq daily, tamsulosin 0.4 mg at bedtime, vitamin B12 500 mcg daily and vitamin D3, 2000 international units daily.   ALLERGIES: ERBITUX CAUSES ITCHING AND HIVES.   FAMILY HISTORY: Noncontributory.   SOCIAL HISTORY: He was previously a smoker. Denies any alcohol or drug use. He quit smoking in 2001.   REVIEW OF SYSTEMS:   CARDIOVASCULAR: He has had lower extremity edema.  RESPIRATORY: He has had increasing shortness of breath and some nonproductive cough; otherwise, negative complete review of systems.    PHYSICAL EXAMINATION: VITAL SIGNS: Temperature 97.7, pulse 91, respirations 18, blood pressure 132/73, oxygen saturation 94% on 2 L/min.  GENERAL: He is a well-developed, well-nourished, elderly Caucasian male in no acute distress.  HEENT: Sclerae clear, nine to conjunctivae pink. Oropharynx pink and moist without lesions.  NECK: Supple without mass, thyromegaly.  CARDIOVASCULAR: Heart regular rate and rhythm. Normal S1, S2. He does have a 2/6 murmur noted. Clicks, rubs or gallops.  LUNGS: With decreased breath sounds bilaterally, mild expiratory wheeze bilaterally.  ABDOMEN: Positive bowel sounds x 4. He is a large left-sided ventral hernia. Abdomen is soft, nontender, without palpable mass or hepatosplenomegaly.  EXTREMITIES:  He has trace lower pretibial edema bilaterally.  SKIN: Pink, warm and dry without any rash or jaundice.  PSYCHIATRIC:   He is alert, oriented, pleasant, cooperative, normal mood and affect.  NEUROLOGIC: He grossly intact.   LABORATORY, DIAGNOSTIC AND RADIOLOGIC DATA: Glucose 140, BUN 33, creatinine 1.6, D-dimer 3.38,  TSH normal. Platelet count normal. Otherwise normal met-7.   He had a CT of the chest without contrast June 27,  which showed a small right pleural effusion, right basilar atelectasis versus pneumonia, chronic obstructive pulmonary disease and large cardiac chambers and calcified gallstones. He had a CT of the chest with contrast, which showed right basilar atelectasis versus pneumonia, small left pleural effusion, no pulmonary embolus, mild cardiac chamber enlargement. Esophageal wall thickening.   IMPRESSION: Chad Luna is a pleasant 81 year old male with history of recurrent esophageal adenocarcinoma and congestive heart failure. He was admitted with acute on chronic respiratory failure due to chronic obstructive pulmonary disease, congestive heart failure and aspiration, pneumonitis. There are two concerns; one is bilateral pleural effusions are related to aspiration versus congestive heart failure and second whether distal esophagus can be dilated, since barium pill will not pass.  I have discussed the case with both Dr. Shlomo Norris and Dr. Caryl Comes. He will undergo EGD with possible esophageal dilation if no bulky term tumor is found tomorrow. I discussed his care with both the patient and his daughter. I discussed risks and benefits to include, but limited to bleeding, infection, perforation drug reaction. He agrees with this plan and consent will be obtained.   PLAN: 1.  Agree with proton pump inhibitor.   2.  Nothing oral  after midnight.  3.  Esophagogastroduodenoscopy positive side esophageal dilation tomorrow.  4.  Further recommendations pending esophagogastroduodenoscopy.   5.  Suspend Lovenox for esophagogastroduodenoscopy.   We would like to thank for allowing [place] to participate in the care of this  patient.     ____________________________ Andria Meuse, NP klj:cc D: 11/04/2012 16:47:58 ET T: 11/04/2012 17:20:25 ET JOB#: [id number]  cc: Andria Meuse, NP, <Dictator> Adin Hector, MD [place] SIGNED 11/12/2012 12:32

## 2014-08-27 NOTE — Consult Note (Signed)
Chief Complaint:  Subjective/Chief Complaint seen for dysphagia.  doing well tolerating clears.   VITAL SIGNS/ANCILLARY NOTES: **Vital Signs.:   19-Oct-14 13:17  Vital Signs Type Routine  Temperature Temperature (F) 97.6  Pulse Pulse 76  Respirations Respirations 21  Systolic BP Systolic BP 747  Diastolic BP (mmHg) Diastolic BP (mmHg) 90  Mean BP 118  Pulse Ox % Pulse Ox % 99  Pulse Ox Activity Level  At rest  Oxygen Delivery 3L   Brief Assessment:  Cardiac Regular   Respiratory clear BS   Gastrointestinal details normal Soft  Nontender  Nondistended  Bowel sounds normal   Lab Results: Hepatic:  19-Oct-14 05:34   Bilirubin, Total 0.5  Alkaline Phosphatase 102  SGPT (ALT) 19  SGOT (AST) 26  Total Protein, Serum  6.2  Albumin, Serum  2.7  Routine Micro:  16-Oct-14 23:09   Micro Text Report BLOOD CULTURE   COMMENT                   NO GROWTH IN 48 HOURS   ANTIBIOTIC                       Micro Text Report BLOOD CULTURE   COMMENT                   NO GROWTH IN 48 HOURS   ANTIBIOTIC                       Culture Comment NO GROWTH IN 48 HOURS  Result(s) reported on 21 Feb 2013 at 11:00PM.  Culture Comment NO GROWTH IN 48 HOURS  Result(s) reported on 21 Feb 2013 at 11:00PM.  Routine Chem:  19-Oct-14 05:34   Glucose, Serum  105  BUN 13  Creatinine (comp) 1.12  Sodium, Serum 142  Potassium, Serum 4.0  Chloride, Serum  109  CO2, Serum 27  Calcium (Total), Serum 8.5  Osmolality (calc) 284  eGFR (African American) >60  eGFR (Non-African American) >60 (eGFR values <58m/min/1.73 m2 may be an indication of chronic kidney disease (CKD). Calculated eGFR is useful in patients with stable renal function. The eGFR calculation will not be reliable in acutely ill patients when serum creatinine is changing rapidly. It is not useful in  patients on dialysis. The eGFR calculation may not be applicable to patients at the low and high extremes of body sizes,  pregnant women, and vegetarians.)  Anion Gap  6  Routine Hem:  19-Oct-14 05:34   WBC (CBC) 7.8  RBC (CBC) 4.40  Hemoglobin (CBC) 13.4  Hematocrit (CBC) 40.9  Platelet Count (CBC) 160  MCV 93  MCH 30.5  MCHC 32.8  RDW  17.1  Neutrophil % 62.9  Lymphocyte % 13.4  Monocyte % 13.0  Eosinophil % 8.7  Basophil % 2.0  Neutrophil # 4.9  Lymphocyte # 1.0  Monocyte # 1.0  Eosinophil # 0.7  Basophil #  0.2 (Result(s) reported on 22 Feb 2013 at 05:51AM.)   Assessment/Plan:  Assessment/Plan:  Assessment 1) dysphagia in the setting of history of distal esophageal adenoca, s/p radiation and chemotherapy.  EGD yesterday with biopsies, awaiting result.  now tolerating clears, will start full liquids, advance to soft diet tomorrow.   Plan as above. following.   Electronic Signatures: SLoistine Simas(MD)  (Signed 19-Oct-14 15:31)  Authored: Chief Complaint, VITAL SIGNS/ANCILLARY NOTES, Brief Assessment, Lab Results, Assessment/Plan   Last Updated: 19-Oct-14 15:31 by SLoistine Simas(MD)

## 2014-08-27 NOTE — H&P (Signed)
PATIENT NAME:  Chad Luna, Chad Luna MR#:  [id number] DATE OF BIRTH:  08/06/1926  DATE OF ADMISSION:  02/20/2013  PRIMARY CARE PHYSICIAN:  Dr. Ramonita Lab.   REFERRING PHYSICIAN:  Dr. Carrie Mew.   CHIEF COMPLAINT:  Nausea, vomiting.   HISTORY OF PRESENT ILLNESS:  Chad Luna is an 81 year old pleasant white male with a past medical history of esophageal cancer recurrent, hypertension, hyperlipidemia, COPD, presented to the Emergency Department with complaints of vomiting and unable to keep down any food for the last two days.  The patient has this problem on and off, resolves by itself, however this time patient unable to keep down any food.  Concerning this, came to the Emergency Department.  Found to have mild elevation of creatinine to 1.41.  Denies having any abdominal pain.  Denies having any cough, shortness of breath.  Denies having any dysuria.  Denies having any fever.   PAST MEDICAL HISTORY: 1.  COPD.  2.  Esophageal CA status post radiation and chemotherapy.  3.  Coronary artery disease, status post stent placement.  4.  Diabetes mellitus.  5.  Chronic anemia.  6.  Hypertension.  7.  Perforated duodenal ulcer requiring exploratory laparotomy and repair.  8.  History of bladder cancer, multiple treatments.  9.  History of fall with left femur and right radius fracture status post open reduction and internal fixation of the left hip.   ALLERGIES:  UNKNOWN CHEMO MEDICATION CAUSED ITCHING.   HOME MEDICATIONS: 1.  Vitamin D3 2000 units daily.  2.  Vitamin B12 500 mcg once a day.  3.  Tamsulosin 0.4 mg once a day.  4.  Potassium chloride 10 mEq once a day.  5.  Nexium 40 mg once a day.  6.  Lasix 40 mg once a day.  7.  Ferrous sulfate 1 tablet once a day.  8.  Docusate sodium 100 mg once a day.  9.  Cardizem 240 mg 1 capsule once a day.  10.  Combivent 1 puff 4 times a day.  11.  Aspirin 81 mg daily.  12.  Allopurinol 100 mg once a day.   SOCIAL HISTORY:  Smoked heavily in the  past, quit in 2000 when the patient was initially diagnosed with esophageal cancer.  No history of alcohol use, lives by himself, independent of ADLs.   FAMILY HISTORY:  History of prostate cancer.   REVIEW OF SYSTEMS: CONSTITUTIONAL:  Generalized weakness.  EYES:  No change in vision.  EARS, NOSE, THROAT:  Hard of hearing.  RESPIRATORY:  No cough, shortness of breath.  CARDIOVASCULAR:  No chest pain, palpitations.  GASTROINTESTINAL:  Vomiting.  No abdominal pain.  GENITOURINARY:  No dysuria or hematuria.  ENDOCRINE:  No polyuria or polydipsia. HEMATOLOGY:  No easy bruising or bleeding.  SKIN:  No rash or lesions.  MUSCULOSKELETAL:  Has osteoarthritis.   NEUROLOGIC:  No weakness or numbness in any part of the body.   PHYSICAL EXAMINATION: GENERAL:  This is a well-built, well-nourished, age-appropriate male, lying down in the bed, not in distress.  VITAL SIGNS:  Temperature 98, pulse 85, blood pressure 134/83, respiratory rate of 20, oxygen saturations 95% on room air.  HEENT:  Head normocephalic, atraumatic.  Eyes, no sclerae icterus.  Conjunctivae normal.  Extraocular movements are intact.  Mucous membranes dry.  No pharyngeal erythema.  NECK:  Supple.  Has lymphadenopathy submandibular.  No JVD.  No carotid bruit.  CHEST:  Has no focal tenderness.  Somewhat prolonged expiratory phase.  HEART:  S1, S2.  Regular.  No murmurs are heard.  ABDOMEN:  Bowel sounds plus.  Soft, nontender, nondistended.  EXTREMITIES:  No pedal edema.  Pulses 2+.  NEUROLOGIC:  The patient is alert, oriented to place, person and time.  Cranial nerves II through XII intact.  Motor 5 by 5 in upper and lower extremities.  SKIN:  No rash or lesions.  MUSCULOSKELETAL:  Good range of motion in all the extremities.   LABORATORY DATA:  CMP:  BUN 18, creatinine of 1.41.  CBC:  WBC of 8.8, hemoglobin 15.6, platelet count of 156.  Urinalysis, 2+ leukocyte esterase, 10 WBC, 1+ bacteria.   ASSESSMENT AND PLAN:  Chad Luna  is an 81 year old male with known history of esophageal cancer, in remission per patient's daughter, comes to the Emergency Department with vomiting, recurrent.   1.  Vomiting.  The patient states immediately vomits what he ate.  Concerned about distal esophageal stricture versus gastric outlet obstruction, concerning about the patient's previous history of radiation treatment.  Admit the patient to the medical bed.  Keep the patient nothing by mouth.  Continue with IV fluids.  Consult gastroenterology in the morning.  2.  Dehydration.  Continue with IV fluids.  3.  Hypertension.  We will hold the medications for now.  4.  Urinary tract infection.  We will obtain urine cultures.  We will keep the patient on Rocephin.  5.  History of esophageal cancer, as mentioned above we will consult gastroenterology.  6.  Debility.  We will involve the physical therapy.  7.  We will hold the DVT prophylaxis initiation until EGD done.   TIME SPENT:  45 minutes.    ____________________________ Monica Becton, MD pv:ea D: 02/20/2013 00:33:42 ET T: 02/20/2013 01:27:05 ET JOB#: [id number]  cc: Monica Becton, MD, <Dictator> Adin Hector, MD Monica Becton MD ELECTRONICALLY SIGNED 03/07/2013 3:09

## 2014-08-27 NOTE — Consult Note (Signed)
   Comments   I met with pt's daughter, Haynes Kerns. 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 Judeen Hammans understands that pt's cancer cannot be cured and that his acute symptoms may be secondary to a recurrence. She wants pt to make his own decision about further tx if offered. Judeen Hammans realizes that pt is going to reach the point when he will be unable to care for himself at home but says that pt is very independent and giving up his independence will not be easy. We talked about getting hospice involved if/when appropriate. Pt's wife died at the [facility] so family is very familiar with hospice care and would accept it if pt would.  discuss goals of therapy further with pt and family after GI work-up.   Electronic Signatures: Keighan Amezcua, Izora Gala (MD)  (Signed 17-Oct-14 12:52)  Authored: Palliative Care   Last Updated: 17-Oct-14 12:52 by Linea Calles, Izora Gala (MD)

## 2014-08-27 NOTE — Consult Note (Signed)
Brief Consult Note: Diagnosis: Dysphagia/vomiting.   Patient was seen by consultant.   Consult note dictated.   Comments: Appreciate consult for 81 y/o caucasian man with history of esophageal cancer followed by Dr Grayland Ormond, for evaluation of dysphagia/vomiting. States he was doing well when he saw Dr Grayland Ormond last 9/14, developed intermittent dysphagia/odynophaghia over the last couple of weeks with vomiting, and the increased severely yesterday. States anything he swallows except for medications gets stuck in his mid chest and then regurgitates.  Had EGD 3/14 with findings of esoph adenocarcinoma. EGD repeated 7/14 by Dr Rodger Norris with no adenocarcinoma. Cannot remember his last chemotherapy tx, states it has been  "awhile".  Denies all other GI complaints presently. Impression/plan: Dysphagia/odynophagia with vomiting. Concerning for recurrence of adenocarcinoma. Plan for EGD when clinically feasible.  Electronic Signatures: Stephens November H (NP)  (Signed 17-Oct-14 16:54)  Authored: Brief Consult Note   Last Updated: 17-Oct-14 16:54 by Theodore Demark (NP)

## 2014-08-27 NOTE — Consult Note (Signed)
Chief Complaint:  Subjective/Chief Complaint seen for dysphagia, tolerating soft diet, no n/v or abdominal pain.   VITAL SIGNS/ANCILLARY NOTES: **Vital Signs.:   20-Oct-14 04:20  Vital Signs Type Routine  Temperature Temperature (F) 98.6  Celsius 37  Temperature Source oral  Pulse Pulse 106  Respirations Respirations 20  Systolic BP Systolic BP 161  Diastolic BP (mmHg) Diastolic BP (mmHg) 90  Mean BP 106  Pulse Ox % Pulse Ox % 96  Pulse Ox Activity Level  At rest  Oxygen Delivery 3L    13:34  Vital Signs Type Routine  Temperature Temperature (F) 97.4  Celsius 36.3  Temperature Source oral  Pulse Pulse 110  Respirations Respirations 20  Systolic BP Systolic BP 096  Diastolic BP (mmHg) Diastolic BP (mmHg) 84  Mean BP 110  Pulse Ox % Pulse Ox % 94  Pulse Ox Activity Level  At rest  Oxygen Delivery 3L   Brief Assessment:  Cardiac Irregular   Respiratory clear BS   Gastrointestinal details normal Soft  Nontender  Nondistended  Bowel sounds normal   Lab Results: Routine Chem:  19-Oct-14 05:34   Osmolality (calc) 284   Assessment/Plan:  Assessment/Plan:  Assessment 1) dysphagia, h/o esophageal cancer.  biopsies pending   Plan 1)  continue soft, edentulous diet.  awaiting biopsies.   Electronic Signatures: Loistine Simas (MD)  (Signed 20-Oct-14 21:36)  Authored: Chief Complaint, VITAL SIGNS/ANCILLARY NOTES, Brief Assessment, Lab Results, Assessment/Plan   Last Updated: 20-Oct-14 21:36 by Loistine Simas (MD)

## 2014-08-27 NOTE — Discharge Summary (Signed)
PATIENT NAME:  CAYDN, JUSTEN MR#:  [id number] DATE OF BIRTH:  1927/03/08  DATE OF ADMISSION:  02/20/2013 DATE OF DISCHARGE:  02/24/2013  FINAL DIAGNOSES: 1. Esophageal obstruction secondary to esophagitis.  2. Persistent adenocarcinoma of the esophagus.  3. Coronary artery disease.  4. Adult onset diabetes mellitus.  5. Chronic obstructive pulmonary disease with chronic respiratory failure.  6. Chronic left side diastolic congestive heart failure.  7. Hypertension.  9. History of bladder cancer.   HISTORY AND PHYSICAL: Please see dictated admission history and physical.   [place]: The patient was admitted with difficulty swallowing, not tolerating anything orally. He was placed on medications versus possible Candida esophagitis, as well as placed on proton pump inhibitors for a standard esophagitis. He has history of esophageal cancer, there was a question whether the tumor could of grown and disrupted the lumen of the esophagus. GI was consulted and the patient underwent endoscopy, which revealed extremely friable esophageal lumen. Biopsies were obtained, and these are still pending at this time. The patient shows slow improvement in his diet, was able to be advanced. He tolerated a soft diet and his medications were able to be resumed.   He was maintained on his home rate of oxygen for his chronic respiratory failure. Initially his medications were held for his chronic left side diastolic congestive heart failure due to his inability to swallow, however, these were able to be reintroduced and this appeared to be reasonably stable during his hospitalization.   At this point, the patient will need to weigh himself daily, calling for more than 2 pounds gain in one day or 5 pounds in one week or increasing signs or symptoms of heart failure including weight gain, fatigue, edema, dyspnea, and he is familiar with these symptoms. His diet will be  a 2 gram sodium, soft diet. He will  be up with rolling walker as tolerated. He will continue on oxygen at 3 liters nasal cannula. He will follow up in our office within the next 1 to 2 weeks. Physical therapy and home health nursing were ordered for the patient to improve his oxygenation, monitor his oral intake, improve his ambulation and endurance.   DISCHARGE MEDICATIONS: 1. Allopurinol 100 mg p.o. daily.  2. Vitamin B12 500 mcg p.o. daily.  3. Furosemide 40 mg p.o. daily.  4. Combivent metered dose inhaler 1 puff 4 times a day as needed for shortness of breath.  5. Colace 100 mg p.o. at bedtime.  6. Tamsulosin 0.4 mg p.o. at bedtime.  7. Vitamin D3 2000 units p.o. daily.  8. Potassium 10 mEq p.o. daily.  9. Cardizem CD 240 mg p.o. daily.  10. Lisinopril  5 mg p.o. daily.  11. Fluconazole 100 mg p.o. daily x 16 days.  12. Pantoprazole 40 mg p.o. b.i.d.  13. Levaquin 250 mg p.o. daily x 10 days for enterococcal urinary tract infection.  14. Symbicort 160/4.5, 2 puffs b.i.d.   The patient was given instructions to stop Nexium and stop iron.   He is not a beta blocker candidate secondary to his severe COPD.  CODE STATUS: The patient is DO NOT RESUSCITATE and has out of facility DO NOT RESUSCITATE orders, and this was continued during this hospitalization.    ____________________________ Tama High III, MD bjk:sg D: 02/24/2013 08:06:28 ET T: 02/24/2013 08:23:36 ET JOB#: [id number]  cc: Adin Hector, MD, <Dictator> Ramonita Lab MD ELECTRONICALLY SIGNED 02/27/2013 12:37

## 2014-08-27 NOTE — Consult Note (Signed)
Chief Complaint:  Subjective/Chief Complaint seen for dyaphagia, h/o esophageal Ca.  patietn is handling secretions well, deneis n/v or abdominal pain.  no odynophagia.   VITAL SIGNS/ANCILLARY NOTES: **Vital Signs.:   18-Oct-14 04:04  Vital Signs Type Routine  Temperature Temperature (F) 98.1  Celsius 36.7  Temperature Source oral  Pulse Pulse 98  Respirations Respirations 20  Systolic BP Systolic BP 735  Diastolic BP (mmHg) Diastolic BP (mmHg) 83  Mean BP 101  Pulse Ox % Pulse Ox % 94  Pulse Ox Activity Level  At rest  Oxygen Delivery 4L   Brief Assessment:  Cardiac Regular   Respiratory clear BS   Gastrointestinal details normal Soft  Nontender  Nondistended  No masses palpable  midline hernia.   Lab Results: Routine Chem:  18-Oct-14 05:56   Result Comment - This specimen was collected through an   - indwelling catheter or arterial line.  - A minimum of 65ms of blood was wasted prior    - to collecting the sample.  Interpret  - results with caution.  Result(s) reported on 21 Feb 2013 at 06:25AM.  BUN 15  Creatinine (comp) 1.22  Sodium, Serum 144  Potassium, Serum 4.0  Chloride, Serum  109  CO2, Serum 28  Calcium (Total), Serum 8.6  Anion Gap 7  Osmolality (calc) 287  eGFR (African American) >60  eGFR (Non-African American)  54 (eGFR values <620mmin/1.73 m2 may be an indication of chronic kidney disease (CKD). Calculated eGFR is useful in patients with stable renal function. The eGFR calculation will not be reliable in acutely ill patients when serum creatinine is changing rapidly. It is not useful in  patients on dialysis. The eGFR calculation may not be applicable to patients at the low and high extremes of body sizes, pregnant women, and vegetarians.)  Routine Hem:  18-Oct-14 05:56   WBC (CBC) 7.9  RBC (CBC)  4.30  Hemoglobin (CBC) 13.1  Hematocrit (CBC)  39.8  Platelet Count (CBC) 169  MCV 93  MCH 30.5  MCHC 33.0  RDW  16.7  Neutrophil % 67.4   Lymphocyte % 12.1  Monocyte % 10.9  Eosinophil % 8.1  Basophil % 1.5  Neutrophil # 5.3  Lymphocyte # 1.0  Monocyte # 0.9  Eosinophil # 0.6  Basophil # 0.1   Radiology Results: XRay:    16-Oct-14 21:55, Abdomen Flat and Erect  Abdomen Flat and Erect   REASON FOR EXAM:    vomiting  COMMENTS:       PROCEDURE: DXR - DXR ABDOMEN 2 V FLAT AND ERECT  - Feb 19 2013  9:55PM     RESULT: The aorto iliac stent graft are present. The lung bases show   minimal density in the right lung base which could represent infiltrate   or atelectasis. The bowel gas pattern is unremarkable. No free air is   evident. There has been ORIF of a previous left hip fracture. Some   calcific densities project to the right of the spine and could be   secondary to vascular calcification, or less likely cholelithiasis or   renal calculi. Retained barium from a previous barium study in   diverticula may sometimes give a similar appearance. Ingested material in   the colon could also give this appearance.  IMPRESSION:   1. No evidence of bowel obstruction or perforation.    Dictation Site: 1        Verified By: GESundra AlandM.D., MD    16-Oct-14 21:55, Chest  PA and Lateral  Chest PA and Lateral   REASON FOR EXAM:    vomiting  COMMENTS:       PROCEDURE: DXR - DXR CHEST PA (OR AP) AND LATERAL  - Feb 19 2013  9:55PM     RESULT: Comparison is made to the exam of 11/04/2012. There is a   right-sided Port-A-Cath device present with tip of the catheter   projecting in the superior vena cava. The cardiac silhouette is   borderline enlarged. There is hazy increased density at the right lung   base which could represent minimal infiltrate or atelectasis/fibrosis.   The lungs are otherwise relatively clear but hyperinflated. There is no   significant effusion.    IMPRESSION:  COPD with borderline to mild cardiomegaly and right lung   base atelectasis, infiltrate or fibrosis.  Dictation Site:  1        Verified By: Sundra Aland, M.D., MD   Assessment/Plan:  Assessment/Plan:  Assessment 1) recurrent dysphagia.  history of esophageal adenocarcinoma.  Patient has had radiation treatment aned chemotherapy, last PET indicating a single focus about the bottom of the esophagus.  last egd showing "fungating " lesion, but bx negative for Ca.   Plan 1) EGD today,  I have discussed the risks benefits and copmplicatiosn of egd to include not limited to bleeding infection perforation and sedation and he wishes to proceed.   Electronic Signatures: Loistine Simas (MD)  (Signed 18-Oct-14 09:46)  Authored: Chief Complaint, VITAL SIGNS/ANCILLARY NOTES, Brief Assessment, Lab Results, Radiology Results, Assessment/Plan   Last Updated: 18-Oct-14 09:46 by Loistine Simas (MD)

## 2014-08-27 NOTE — Consult Note (Signed)
Chief Complaint:  Subjective/Chief Complaint Patietn seen and examined, please see full GI consult note and brief consult note.  Patient with h/o esopohageal adenocarcinoma, s/p radiation and chemotherapy.  Last egd 11/24/12  showing a "fungating lesion" in the distal esophagus, though biopsies were not confirmative of recurrent esophageal cancer.  Patient with dysphagia worsening over the past several days, with regurgitation of foods.  Will arrange for egd tomorrow, unsure of timing, dependant on OR schedule.  I have discussed the risks benefits and complicatiosn of egd to include not limited to bleeding infection perforation and sedation and he wishes to proceed.  Further recs to follow.  agree with fluconazole and ppi.   VITAL SIGNS/ANCILLARY NOTES: **Vital Signs.:   17-Oct-14 20:09  Vital Signs Type Routine  Temperature Temperature (F) 98.2  Temperature Source oral  Pulse Pulse 84  Respirations Respirations 20  Systolic BP Systolic BP 790  Diastolic BP (mmHg) Diastolic BP (mmHg) 87  Mean BP 106  Pulse Ox % Pulse Ox % 96  Pulse Ox Activity Level  At rest  Oxygen Delivery 4L   Brief Assessment:  Cardiac Regular   Respiratory clear BS   Gastrointestinal details normal Soft  Nontender  Nondistended  Bowel sounds normal   Lab Results: Hepatic:  16-Oct-14 17:30   Bilirubin, Total 0.6  Alkaline Phosphatase 119  SGPT (ALT) 29  SGOT (AST) 34  Total Protein, Serum 7.3  Albumin, Serum 3.4  Routine Micro:  16-Oct-14 23:09   Micro Text Report BLOOD CULTURE   COMMENT                   NO GROWTH IN 8-12 HOURS   ANTIBIOTIC                       Micro Text Report BLOOD CULTURE   COMMENT                   NO GROWTH IN 8-12 HOURS   ANTIBIOTIC                       Culture Comment NO GROWTH IN 8-12 HOURS  Result(s) reported on 20 Feb 2013 at 07:00AM.  Culture Comment NO GROWTH IN 8-12 HOURS  Result(s) reported on 20 Feb 2013 at 07:00AM.  Cardiology:  16-Oct-14 20:52    Ventricular Rate 89  Atrial Rate 89  P-R Interval 180  QRS Duration 88  QT 386  QTc 469  P Axis 39  R Axis -63  T Axis 7  ECG interpretation Sinus rhythm with Premature supraventricular complexes with occasional , and consecutive Premature ventricular complexes and Fusion complexes Left axis deviation Inferior infarct (cited on or before 19-Feb-2013) T wave abnormality, consider anterior ischemia Abnormal ECG When compared with ECG of 31-Oct-2012 12:35, Significant changes have occurred ----------unconfirmed---------- Confirmed by OVERREAD, NOT ([id number]), editor PEARSON, BARBARA ([id number]) on 02/20/2013 11:57:25 AM  Routine Chem:  16-Oct-14 17:30   Magnesium, Serum  1.1 (1.8-2.4 THERAPEUTIC RANGE: 4-7 mg/dL TOXIC: > 10 mg/dL  -----------------------)  Phosphorus, Serum 3.6 (Result(s) reported on 20 Feb 2013 at 12:39AM.)  Glucose, Serum  130  BUN 18  Creatinine (comp)  1.41  Sodium, Serum 140  Potassium, Serum 4.0  Chloride, Serum 105  CO2, Serum 30  Calcium (Total), Serum 9.5  Osmolality (calc) 283  eGFR (African American)  53  eGFR (Non-African American)  45 (eGFR values <71m/min/1.73 m2 may be an indication of chronic kidney disease (  CKD). Calculated eGFR is useful in patients with stable renal function. The eGFR calculation will not be reliable in acutely ill patients when serum creatinine is changing rapidly. It is not useful in  patients on dialysis. The eGFR calculation may not be applicable to patients at the low and high extremes of body sizes, pregnant women, and vegetarians.)  Anion Gap  5  Lipase 227 (Result(s) reported on 19 Feb 2013 at 06:03PM.)  Cardiac:  16-Oct-14 17:30   Troponin I 0.02 (0.00-0.05 0.05 ng/mL or less: NEGATIVE  Repeat testing in 3-6 hrs  if clinically indicated. >0.05 ng/mL: POTENTIAL  MYOCARDIAL INJURY. Repeat  testing in 3-6 hrs if  clinically indicated. NOTE: An increase or decrease  of 30% or more on serial  testing suggests a   clinically important change)  Routine UA:  16-Oct-14 17:30   Color (UA) Yellow  Clarity (UA) Clear  Glucose (UA) Negative  Bilirubin (UA) Negative  Ketones (UA) Negative  Specific Gravity (UA) 1.008  Blood (UA) Negative  pH (UA) 5.0  Protein (UA) Negative  Nitrite (UA) Negative  Leukocyte Esterase (UA) 2+ (Result(s) reported on 19 Feb 2013 at 05:51PM.)  RBC (UA) 3 /HPF  WBC (UA) 10 /HPF  Bacteria (UA) 1+  Epithelial Cells (UA) 1 /HPF  Mucous (UA) PRESENT  Hyaline Cast (UA) 17 /LPF (Result(s) reported on 19 Feb 2013 at 05:51PM.)  Routine Hem:  16-Oct-14 17:30   WBC (CBC) 8.8  RBC (CBC) 5.01  Hemoglobin (CBC) 15.6  Hematocrit (CBC) 45.6  Platelet Count (CBC) 186 (Result(s) reported on 19 Feb 2013 at 05:47PM.)  MCV 91  MCH 31.1  MCHC 34.2  RDW  17.3   Electronic Signatures: Loistine Simas (MD)  (Signed 17-Oct-14 22:11)  Authored: Chief Complaint, VITAL SIGNS/ANCILLARY NOTES, Brief Assessment, Lab Results   Last Updated: 17-Oct-14 22:11 by Loistine Simas (MD)

## 2014-08-28 NOTE — Discharge Summary (Signed)
PATIENT NAME:  Chad Luna, Chad Luna MR#:  [id number] DATE OF BIRTH:  Sep 11, 1926  DATE OF ADMISSION:  09/21/2013 DATE OF DISCHARGE:  09/29/2013   FINAL DIAGNOSES:  1. Lower gastrointestinal bleeding.  2. Acute on chronic blood loss anemia secondary to #1.  3. History of pulmonary embolism, April 2015.  4. Recurrent esophageal cancer.  5. Recurrent emesis with evidence of cholelithiasis.  6. Hypertension.  7. Benign prostatic hypertrophy.  8. History of peripheral vascular disease with prior aorta stent for aneurysm repair.   HISTORY AND PHYSICAL: Please see dictated admission history and physical  [place]: The patient was admitted with evidence of lower GI bleeding, bright red blood per rectum. His INR had been elevated as an outpatient at approximately 4, per report. Coumadin was held, and his bleeding stopped spontaneously. GI was consulted, and they did not recommend luminal evaluation. Oncology was consulted as well given his recent pulmonary embolism in April 2015. It was oncology's thought that he needed to go back on anticoagulation secondary to the recent clot as well as his high risk for recurrence; however, it was felt that for added protection that he would benefit from IVC filter placement, and this was performed through vascular surgery. Lovenox was started at 1 mg/kg dosing, and he tolerated this well without further bleeding. Anticipation is to have him continue this for approximately 1 more week, and if he has no further evidence of bleeding, he may be converted over to Coumadin. It was not recommended that he go on Xarelto, Eliquis, Pradaxa, or any of the other non-immediately reversible anticoagulants out of concern about recurrent bleeding and his high risk. This was discussed with the patient and family members, and they are in agreement.   The patient developed postprandial vomiting; he reported that he had been having some of this even prior to coming into the  hospital. He had no abdominal pain with it, and typically would get about a few seconds' warning before regurgitation. He had had evaluation through GI, including endoscopy for this, which revealed evidence of the recurrent esophageal cancer; however, this was nonobstructing, and there was no evidence of issue within his stomach or duodenum. Despite multiple medication adjustments, this continued to be an issue. He underwent CT of the head with contrast, which showed no evidence of brain metastasis that would be triggering the above. He underwent ultrasound, which revealed cholelithiasis and possible slight thickening of the gallbladder, question chronic cholecystitis; however, in the presence of recurrent vomiting, it was difficult to interpret this. Discussion was held with the patient and his family members. With the downgrading of his diet to full liquid and with the addition of a scopolamine patch, his symptoms did improve. It is felt, at this time, that because he would be extremely high risk for any surgical intervention and symptomatically is better, that we would continue this conservative therapy. However, if he does have recurrent episodes of postprandial vomiting or develops abdominal pain, he may require hepatobiliary scan and/or surgical consultation for consideration for cholecystectomy.   At this time, his overall condition is poor enough that he does not appear to be a chemotherapy candidate. He will need to follow up with oncology in the event that he does begin to improve, and a followup appointment will be made in the next couple of weeks with Dr. Grayland Ormond to re-evaluate this as well as to follow for his history of clot.   At this time, the patient will be discharged to a  nursing facility in stable condition. His physical activity will be up with a walker as tolerated. He will be on a full liquid diet, though this may be advanced to regular as tolerated provided he has no symptoms over the  next 4 to 5 days. He should follow up with the nursing home physician, and he will need a MET-B and CBC performed in 1 week, with results to the nursing home physician. Physical therapy should evaluate and treat the patient. He will be on home oxygen at 2 liters nasal cannula continuously.   DISCHARGE MEDICATIONS:  1. Allopurinol 100 mg p.o. daily.  2. Vitamin B12 500 mcg p.o. daily.  3. Vitamin D3 2000 units p.o. daily.  4. Lasix 40 mg p.o. daily as needed for edema.  5. Potassium 10 mEq p.o. daily as needed for edema when taking Lasix.  6. Docusate 100 mg p.o. daily.  7. Flomax 0.4 mg p.o. at bedtime.  8. Hydrocortisone 1 application rectally 2 times a day as needed for hemorrhoids.  9. Reglan 5 mg p.o. t.i.d. before meals.  10. Ondansetron 4 mg p.o. t.i.d. before meals.  11. DuoNeb SVN 4 times a day as needed for wheezing.  12. Symbicort 160/4.5 mcg 2 puffs b.i.d.  13. Pantoprazole 40 mg p.o. b.i.d.  14. Lovenox 75 mg subcutaneous b.i.d.; again, if he has no bleeding, then Coumadin may be introduced to replace this medication at the discretion of the nursing home physician.  15. Scopolamine patch 1 topically every 3 days.  16. Lopressor 25 mg p.o. b.i.d.   CODE STATUS: The patient is do not resuscitate. An out-of-facility DNR order will be sent with him to the facility.   TIME OF DISCHARGE: 40 minutes.   ____________________________ Adin Hector, MD bjk:[id number] D: 09/29/2013 12:54:30 ET T: 09/29/2013 13:05:01 ET JOB#: [id number]  cc: Adin Hector, MD, <Dictator> Patient Room 202 for discharge to a [place] MD ELECTRONICALLY SIGNED 09/30/2013 13:28

## 2014-08-28 NOTE — Consult Note (Signed)
Present Illness The patient is an 81 year old male who was recently DC from [facility] on 04/25 after he was treated for bacteremia and pneumonia. The patient was discharged to rehab. The patient presents this admission for rectal bleeding. He reports he has been having bright red blood per rectum for two days prior to admission. He states it has been minimal amount. Denies any nausea, any vomiting, any coffee-ground emesis.  The patient is on Warfarin for recent diagnosis of PE.  The patient has been very difficult to control and is scheduled to transition from warfarin to Xarelto when his INR decreases to a therapeutic level.  The Xarelto has not yet bee started.  On initial evaluation the patient had bright red blood in his diaper. ED physician documented bright red blood per rectum on rectal exam.    PAST MEDICAL HISTORY: 1. Esophageal cancer undergoing chemotherapy.  2. Hemorrhoids.  3. Hypertension.  4. Benign prostatic hypertrophy.  5. Pulmonary embolism.   PAST SURGICAL HISTORY:  1. Hernia.  2. Stent in the aorta for aneurysm repair.  3. Tonsillectomy.  4. Hip fracture surgery.  5. Hemorrhoidectomy.   Home Medications: Medication Instructions Status  hydrocortisone 1 application rectal 2 times a day, As needed, hemorrhoids Active  metoclopramide 10 mg oral tablet 1 tab(s) orally 3 times a day (before meals) Active  warfarin 2 mg oral tablet 1 tab(s) orally once a day Active  levofloxacin 500 mg oral tablet 1 tab(s) orally once a day x 10 days Active  Vitamin B-12 500 mcg oral tablet 1 tab(s) orally once a day for supplement Active  allopurinol 100 mg oral tablet 1 tab(s) orally once a day for gout Active  Vitamin D3 2000 intl units oral tablet 1 tab(s) orally once a day for supplement Active  Symbicort 160 mcg-4.5 mcg/inh inhalation aerosol 2 puff(s) inhaled 2 times a day for COPD Active  Protonix 40 mg oral delayed release tablet 1 tab(s) orally 2 times a day for GERD  Active  Lasix 40 mg oral tablet 1 tab(s) orally every 24 hours, As Needed for edema Active  potassium chloride 10 mEq oral tablet, extended release 1 tab(s) orally every 24 hours, As Needed for edema when taking Lasix Active  Zofran 4 mg oral tablet 1 tab(s) orally every 8 hours, As Needed - for Nausea, Vomiting Active  Combivent Respimat CFC free 100 mcg-20 mcg/inh inhalation aerosol 1 puff(s) inhaled every 6 hours, As Needed - for Shortness of Breath/respiratory distress Active  docusate sodium 100 mg oral capsule 1 cap(s) orally once a day (at bedtime) for constipation prevention Active  Flomax 0.4 mg oral capsule 1 cap(s) orally once a day (at bedtime) for BPH Active    Erbitux: Itching, Hives  Case History:  Family History Non-Contributory   Social History negative tobacco, negative ETOH, negative Illicit drugs   Review of Systems:  Fever/Chills No   Cough No   Sputum No   Abdominal Pain No   Diarrhea No   Constipation No   Nausea/Vomiting No   SOB/DOE No   Chest Pain No   Telemetry Reviewed NSR   Dysuria No   Physical Exam:  GEN well developed, no acute distress   HEENT hearing intact to voice, moist oral mucosa, poor dentition   NECK supple  trachea midline   RESP normal resp effort  no use of accessory muscles   CARD regular rate  No LE edema  no JVD   ABD denies tenderness  soft  EXTR negative cyanosis/clubbing, negative edema   SKIN normal to palpation, No rashes   NEURO cranial nerves intact, follows commands   PSYCH alert, good insight   Nursing/Ancillary Notes: **Vital Signs.:   19-May-15 07:50  Vital Signs Type Routine  Temperature Temperature (F) 97.8  Celsius 36.5  Temperature Source oral  Pulse Pulse 108  Respirations Respirations 18  Systolic BP Systolic BP 811  Diastolic BP (mmHg) Diastolic BP (mmHg) 75  Mean BP 89  Pulse Ox % Pulse Ox % 97  Pulse Ox Activity Level  At rest  Oxygen Delivery 3L   Hepatic:  18-May-15  04:28   Bilirubin, Total 0.5  Alkaline Phosphatase 76 (45-117 NOTE: New Reference Range 03/27/13)  SGPT (ALT) 15  SGOT (AST) 27  Total Protein, Serum  6.1  Albumin, Serum  2.5  Routine BB:  18-May-15 06:50   ABO Group + Rh Type O Positive  Antibody Screen NEGATIVE (Result(s) reported on 21 Sep 2013 at 08:34AM.)  Routine Chem:  18-May-15 04:28   Glucose, Serum  100  BUN  24  Creatinine (comp)  1.69  Sodium, Serum 143  Potassium, Serum  3.4  Chloride, Serum 102  CO2, Serum  37  Calcium (Total), Serum  8.4  Anion Gap  4  Osmolality (calc) 289  eGFR (African American)  42  eGFR (Non-African American)  36 (eGFR values <59m/min/1.73 m2 may be an indication of chronic kidney disease (CKD). Calculated eGFR is useful in patients with stable renal function. The eGFR calculation will not be reliable in acutely ill patients when serum creatinine is changing rapidly. It is not useful in  patients on dialysis. The eGFR calculation may not be applicable to patients at the low and high extremes of body sizes, pregnant women, and vegetarians.)  19-May-15 04:02   Glucose, Serum 98  BUN  21  Creatinine (comp)  1.36  Sodium, Serum 142  Potassium, Serum  3.2  Chloride, Serum 103  CO2, Serum  36  Calcium (Total), Serum 8.6  Anion Gap  3  Osmolality (calc) 286  eGFR (African American)  55  eGFR (Non-African American)  47 (eGFR values <640mmin/1.73 m2 may be an indication of chronic kidney disease (CKD). Calculated eGFR is useful in patients with stable renal function. The eGFR calculation will not be reliable in acutely ill patients when serum creatinine is changing rapidly. It is not useful in  patients on dialysis. The eGFR calculation may not be applicable to patients at the low and high extremes of body sizes, pregnant women, and vegetarians.)  Magnesium, Serum  1.3 (1.8-2.4 THERAPEUTIC RANGE: 4-7 mg/dL TOXIC: > 10 mg/dL  -----------------------)  Routine UA:  18-May-15  06:50   Color (UA) Yellow  Clarity (UA) Clear  Glucose (UA) Negative  Bilirubin (UA) Negative  Ketones (UA) Negative  Specific Gravity (UA) 1.011  Blood (UA) Negative  pH (UA) 6.0  Protein (UA) Negative  Nitrite (UA) Negative  Leukocyte Esterase (UA) Negative (Result(s) reported on 21 Sep 2013 at 07:18AM.)  RBC (UA) NONE SEEN  WBC (UA) <1 /HPF  Bacteria (UA) NONE SEEN  Epithelial Cells (UA) <1 /HPF  Hyaline Cast (UA) 8 /LPF (Result(s) reported on 21 Sep 2013 at 07:18AM.)  Routine Coag:  18-May-15 04:28   Activated PTT (APTT)  43.1 (A HCT value >55% may artifactually increase the APTT. In one study, the increase was an average of 19%. Reference: "Effect on Routine and Special Coagulation Testing Values of Citrate Anticoagulant Adjustment in Patients with High HCT  Values." American Journal of Clinical Pathology 2947;654:650-354.)  Prothrombin  24.9  INR 2.3 (INR reference interval applies to patients on anticoagulant therapy. A single INR therapeutic range for coumarins is not optimal for all indications; however, the suggested range for most indications is 2.0 - 3.0. Exceptions to the INR Reference Range may include: Prosthetic heart valves, acute myocardial infarction, prevention of myocardial infarction, and combinations of aspirin and anticoagulant. The need for a higher or lower target INR must be assessed individually. Reference: The Pharmacology and Management of the Vitamin K  antagonists: the seventh ACCP Conference on Antithrombotic and Thrombolytic Therapy. SFKCL.2751 Sept:126 (3suppl): [id number]. A HCT value >55% may artifactually increase the PT.  In one study,  the increase was an average of 25%. Reference:  "Effect on Routine and Special Coagulation Testing Values of Citrate Anticoagulant Adjustment in Patients with High HCT Values." American Journal of Clinical Pathology 2006;126:400-405.)  Routine Hem:  18-May-15 04:28   WBC (CBC) 7.2  RBC (CBC)  3.67   Hemoglobin (CBC)  10.5  Hematocrit (CBC)  33.0  Platelet Count (CBC)  147 (Result(s) reported on 21 Sep 2013 at 05:42AM.)  MCV 90  MCH 28.7  MCHC  31.9  RDW  21.0    10:11   Hemoglobin (CBC)  10.6 (Result(s) reported on 21 Sep 2013 at 10:29AM.)  19-May-15 04:02   WBC (CBC) 6.6  RBC (CBC)  3.46  Hemoglobin (CBC)  10.1  Hematocrit (CBC)  31.0  Platelet Count (CBC)  131  MCV 90  MCH 29.2  MCHC 32.6  RDW  22.0  Neutrophil % 67.1  Lymphocyte % 13.9  Monocyte % 10.7  Eosinophil % 6.6  Basophil % 1.7  Neutrophil # 4.4  Lymphocyte #  0.9  Monocyte # 0.7  Eosinophil # 0.4  Basophil # 0.1 (Result(s) reported on 22 Sep 2013 at 05:54AM.)    Impression 1. Bright red blood per rectum  The patient has active bleeding and will likely require cessation even if temporary of his anticoagulation.  Given he has had multiple PE's he requires protection from furhter potentially leathal events he requires an IVC filter.  The risks and benefits were discussed with teh patient and he agrees to procced with filter placement. 2. History of pulmonary embolism. INR is currently therapeutic. We will hold his warfarin until he is seen by gastroenterology, it appears to be the patient is being planned to be switched to Xarelto as an outpatient. I would not recommend that as warfarin is much easier to reverse in case the patient has episode of gastrointestinal bleed.  3. Hypokalemia, mild. We will replace.  4. History of esophageal cancer. The patient undergoing chemotherapy. He will continue to follow as an outpatient. 5. Hypertension. Blood pressure is acceptable, but the patient does not appear to be on hypertensive any medications. We will monitor.  6. Benign prostatic hypertrophy. Continue with Flomax.  7. Acute renal failure. The patient appears to be dehydrated. We will have him on IV fluids. We will hold his Lasix.   Plan level 4 consult   Electronic Signatures: Hortencia Pilar (MD)  (Signed  19-May-15 21:34)  Authored: General Aspect/Present Illness, Home Medications, Allergies, History and Physical Exam, Vital Signs, Labs, Impression/Plan   Last Updated: 19-May-15 21:34 by Hortencia Pilar (MD)

## 2014-08-28 NOTE — Consult Note (Signed)
Brief Consult Note: Diagnosis: nausea with vomiting.   Patient was seen by consultant.   Consult note dictated.   Recommend further assessment or treatment.   Orders entered.   Comments: 1.) N/V:  no dysphagia.  Unlikely EGD would be helpful and resp status tenuous currently.  - Would start with barium swallow.  - scheduled zofran.  Electronic Signatures: Arther Dames (MD)  (Signed 12-Apr-15 13:57)  Authored: Brief Consult Note   Last Updated: 12-Apr-15 13:57 by Arther Dames (MD)

## 2014-08-28 NOTE — Consult Note (Signed)
Brief Consult Note: Diagnosis: GI bleed; multiple PE's Gastric CA.   Patient was seen by consultant.   Comments: he will require stopping his anticoagulation given the multiple PE's he should have a filter placed I discussed end of life considerations but he still wishes to proceed with filter.  Electronic Signatures: Hortencia Pilar (MD)  (Signed 19-May-15 18:55)  Authored: Brief Consult Note   Last Updated: 19-May-15 18:55 by Hortencia Pilar (MD)

## 2014-08-28 NOTE — Consult Note (Signed)
Brief Consult Note: Comments: have come to see patient for GI consult, however patient's status has changed, and he is currently being discharged to Hospice home today: arrangements have been made and EMS transport called. Have called Dr Caryl Comes to verify necessity of consult.  Electronic Signatures: Stephens November H (NP)  (Signed 29-May-15 14:40)  Authored: Brief Consult Note   Last Updated: 29-May-15 14:40 by Theodore Demark (NP)

## 2014-08-28 NOTE — Consult Note (Signed)
PATIENT NAME:  Chad Luna, Chad Luna MR#:  [id number] DATE OF BIRTH:  [birth year]  DATE OF CONSULTATION:  08/16/2013  REFERRING PHYSICIAN:  Dr. Lisette Grinder, III  CONSULTING PHYSICIAN:  Arther Dames, MD  DATE OF BIRTH: January 01, 1927  REASON FOR THE CONSULT: History of esophageal cancer, nausea, vomiting.   HISTORY OF PRESENT ILLNESS: The patient is an 81 year old male with a history of esophageal cancer, status post chemotherapy and radiation, with recurrence, under the care of Dr. Grayland Ormond, who presented to the hospital with falls, failure to thrive. During the admission, it also came to note that the patient struggles with intermittent nausea and vomiting. He states that this nausea and vomiting seem to get better after he has chemotherapy sessions, but then worsen.   Currently, in the hospital, his nausea and vomiting are much better. When I asked the patient about any trouble whatsoever with swallowing, he completely denies this. He states that the food gets down just fine. However, when he is having issues with nausea and vomiting, once it gets down, then the food can come up later, after this.   He thinks his last upper endoscopy was about one year ago. He is also not sure of the date of his last barium swallow.   During this hospitalization, he was diagnosed with a pulmonary embolism and was started on Coumadin. He is currently still requiring oxygen. He is having low O2 saturations.   PAST MEDICAL HISTORY: 1.  Esophageal cancer.  2.  Hypertension.  3.  Benign prostatic hypertrophy.  4.  AAA repair.  5.  Hip fracture.    ALLERGIES: NKDA.  CURRENT MEDICATIONS: Allopurinol 100 daily, ciprofloxacin 500 q.12, diltiazem 240 daily, Docusate 100 b.i.d., Reglan 10 q.i.d., pantoprazole 40 mg b.i.d., tamsulosin 0.5 at bedtime, budesonide formoterol inhaler.  REVIEW OF SYSTEMS: A 10-system review was conducted. It is negative except as stated in the history of present illness.   SOCIAL HISTORY: He  denies any tobacco or alcohol to me.   FAMILY HISTORY: No family history of GI malignancy that he is aware of.  PHYSICAL EXAMINATION: VITAL SIGNS: Temperature is 98.1, pulse is 108, respirations are 22, blood pressure is 111/65. He is 94% on 3 liters.  GENERAL: Alert and oriented times 4.  Mild distress. Appears stated age. HEENT: Normocephalic/atraumatic. Extraocular movements are intact. Anicteric. Positive for nasal cannula. NECK: Soft, supple. JVP appears normal. No adenopathy. CHEST: Clear to auscultation. No wheeze or crackle. Respirations unlabored. Positive for significant use of accessory muscles, tachypnea. HEART: Regular. No murmur, rub, or gallop.  Normal S1 and S2. ABDOMEN: Soft, nontender, nondistended.  Normal active bowel sounds in all four quadrants.  No organomegaly. No masses EXTREMITIES: No swelling, well perfused. SKIN: No rash or lesion. Skin color, texture, turgor normal. NEUROLOGICAL: Grossly intact. PSYCHIATRIC: Normal tone and affect. MUSCULOSKELETAL: No joint swelling or erythema.   DIAGNOSTIC DATA: BUN 19, creatinine 1.29, sodium 143, potassium 3.1. His liver enzymes are normal except his albumin is 2.6. Troponin I is slightly elevated at 0.14. White count is 1.2, hemoglobin is currently 10.4 and stable, platelets are 181.   ASSESSMENT AND PLAN: Nausea, vomiting, esophageal cancer. The patient is very adamant that there is no component of dysphagia at this time. Therefore, an esophagogastroduodenoscopy would not be warranted to work up the dysphagia. In terms of the nausea, vomiting, this seems to be very much related to the chemotherapy. I suspect that esophagogastroduodenoscopy for the purposes of working up the nausea and vomiting would be very low  yield. In addition, he would be very high risk for sedation, given his pulmonary embolism and his poor respiratory status currently, his anticoagulation.   I would recommend continued conservative management as you are  doing. We could try scheduling his Zofran every 8 hours instead of leaving it as as needed, which may help some. Otherwise, I agree with the Protonix 40 b.i.d. The Reglan may help some, although I do not think that the issue is gastroparesis, so I would not leave this as a long-term medication. You may want to consider a palliative care consult, given his advanced age and now his recurrent esophageal cancer. He is likely to always have some residual symptoms from his esophageal cancer for the rest of his life.   Thank you for this consult.    ____________________________ Arther Dames, MD mr:cg D: 08/16/2013 23:16:25 ET T: 08/17/2013 01:05:09 ET JOB#: [id number]  cc: Arther Dames, MD, <Dictator> Mellody Life MD ELECTRONICALLY SIGNED 09/01/2013 17:03

## 2014-08-28 NOTE — Consult Note (Signed)
VVS afebrile, Hgb stable, agree with plan of Dr. Caryl Comes.  Agree with possible IVC filter in place of long term anticoagulation if vascular surgery agrees.  Pt well known to me with persistent esophageal cancer with several previous EGDs done for follow up.  No bleeding today.  Electronic Signatures: Manya Silvas (MD)  (Signed on 19-May-15 12:39)  Authored  Last Updated: 19-May-15 12:39 by Manya Silvas (MD)

## 2014-08-28 NOTE — Consult Note (Signed)
PATIENT NAME:  Chad Luna, PRIEN MR#:  [id number] DATE OF BIRTH:  [birth year]  DATE OF CONSULTATION:  09/21/2013  REFERRING PHYSICIAN:  Albertine Patricia, MD CONSULTING PHYSICIAN:  Manya Silvas, MD, and Janalyn Harder. Jerelene Redden, ANP (Adult Nurse Practitioner)  REASON FOR CONSULTATION: Bright red blood per rectum.   HISTORY OF PRESENT ILLNESS: This 81 year old patient of Dr. Ramonita Lab was hospitalized from [facility] for acute episode of rectal bleeding. The patient was recently discharged for from [facility] 08/29/2013 for bacteremia secondary to pneumonia. He does have a history of a pulmonary embolus admission 08/15/2013 and has been on warfarin therapy, Lovenox. His INR was high throughout his hospitalization course recently so his warfarin was started on the date of discharge, which was 08/29/2013.   The patient denies abdominal pain, nausea or vomiting. He states 2 days ago he passed fresh blood. He continued to have rectal bleeding over the weekend, he reports bright red. This was much larger than he has ever seen before. He reports bowel movements have been normal prior. Currently, he says he is resting okay and has complaints of difficulty swallowing and minor constipation.   His most recent colonoscopy was performed 11/15/2006 for hematochezia and personal history of colon polyps and personal history of rectal adenoma notable for 6 polyps, diverticulosis and internal, nonbleeding, medium-sized hemorrhoids. The pathology returned hyperplastic. His most recent EGD performed 02/21/2013 for dysphagia showed friable mucosa in the esophagus, normal stomach, normal duodenum and severe esophagitis versus recurrent cancer. Pathology of the distal esophageal mass showed 2 small fragments of adenocarcinoma, invasive, moderately differentiated and 2 larger fragments of squamous mucosa. He is followed actively by Dr. Grayland Ormond and his chemotherapy has been temporarily on hold through the first week of May  until acute symptoms of his pneumococcal pneumonia and pulmonary embolus have resolved.   PAST MEDICAL HISTORY: 1.  Esophageal cancer, undergoing chemotherapy.  2.  Hemorrhoids.  3.  Hypertension.  4.  Personal history of adenomatous colon polyps.  5.  Benign prostatic hypertrophy.  6.  Pulmonary embolism.  7.  Pneumococcal hospitalization with sepsis 08/2013.  8.  Chronic obstructive pulmonary disease.  9.  Iron deficiency anemia.  10.  Obstructive sleep apnea, on CPAP.  11.  History of duodenal ulcer with perforation, requiring operative repair.  12.  Triple A with stent graft, 2003 at [facility].  13.  History of bladder cancer, multiple treatments.  14.  History of gallstones.   15.  History of adenocarcinoma of the gastroesophageal junction, radiation therapy, chemotherapy. This was T3 NX M0.  16.  Myocardial infarction in 2009 with history of cardiac catheterization.  17.  AODM.    PAST SURGICAL HISTORY: 1.  Bilateral carpal tunnel surgery.  2.  Left knee arthroscopy.  3.  Left hernia repair.  4.  Hemorrhoidectomy.  5.  Open reduction and internal fixation left hip surgery was 05/013.  6.  Tonsillectomy.  7.  Stent in the aorta for aneurysm repair.   HOME MEDICATIONS: Reviewed per history of present illness.   ALLERGIES: No known drug allergies.   HABITS: The patient is currently residing at [facility], he tells me, after his recent hospitalization but previously had been living at home alone. Previous tobacco. Negative alcohol.   REVIEW OF SYSTEMS: Positive for right shoulder discomfort possible rotator cuff problem. He says the pain is livable. GI history as noted. Feels somewhat weaker than normal. He had a fall last month. The patient says he lives alone but he knows  that he is going to need some assistance. He is hoping to get [facility]. He denies fevers or chills, cough, shortness of breath. Has very mild, "not worth mentioning," discomfort in his chest from time to  time. Denies any leg swelling or calf pain. He denies tremors or seizures. He denies significant depression.   PHYSICAL EXAMINATION: VITAL SIGNS: Temperature 97.5, 108, 18, 126/70. Pulse oximetry 96% on 3 liters nasal cannula.  GENERAL: Elderly Caucasian male resting in bed, hard of hearing. NAD.  HEENT: Head is normocephalic. Conjunctivae pink. Oral mucosa is dry and intact.  NECK: Supple. Trachea midline.  LUNGS: Essentially CTA.  CARDIAC: S1, S2 without murmur or gallop.  ABDOMEN: Soft, nontender.  EXTREMITIES: Without edema.  SKIN: Warm and dry. Color is somewhat pale.  MUSCULOSKELETAL: No joint effusion or erythema. Right shoulder not evaluated.  RECTAL EXAM: By me shows yellow-brown, formed stool in the vault. No evidence of fresh blood.   LABORATORY, DIAGNOSTIC AND RADIOLOGICAL DATA:  Admission BUN of 24, creatinine 1.69, potassium 3.4, albumin 2.5, AST 27. WBC 7.2, hemoglobin 10.5 and today 10.6, stable. Pro time 24.9, INR 2.3, PTT 43.1. Urine positive for hyaline cast.    Radiology: Chest x-ray, single view on admission, shows moderate to severe cardiomegaly; tortuous, possible ectatic calcified aortic arch. Strandy densities and blunting of the left costophrenic angle, no pneumothorax. Right chest Port-A-Cath tip projecting in the proximal superior vena cava. Impression is possible interstitial prominence suggesting pulmonary edema with increasing right middle lobe and lower lobe zone air space opacities and at least small right and minimal left pleural effusion with stable left lung base atelectasis.   IMPRESSION:  1.  The patient presents with rectal bleeding, likely related to hemorrhoid bleed. Consider diverticular bleed as well.  Hemoglobin is stable with anticoagulation.  2.  Recent pulmonary embolus, on warfarin.  3.  Presents on warfarin with INR recently 4.2. There are plans to start him on Xarelto 09/22/2013.  4.  Most recent colonoscopy 2008 as noted. The patient with  esophageal cancer and chemotherapy is temporarily on hold due to acute recent hospitalizations for pneumococcal sepsis last month followed by pulmonary embolus.   PLAN: 1.  Recommend vascular consult for possible filter placement.  2.  No recommendation for luminal evaluation at this time.  3.  Continue to follow serial hemoglobins and monitor patient's progress.   This case was discussed with Dr. Vira Agar in collaboration of care. These services were provided by Denice Paradise, ANP.    ____________________________ Janalyn Harder. Jerelene Redden, ANP (Adult Nurse Practitioner) kam:cs D: 09/21/2013 17:47:35 ET T: 09/21/2013 18:48:42 ET JOB#: [id number]  cc: Joelene Millin A. Jerelene Redden, ANP (Adult Nurse Practitioner), <Dictator> Janalyn Harder Sherlyn Hay, MSN, ANP-BC Adult Nurse Practitioner ELECTRONICALLY SIGNED 09/22/2013 8:37

## 2014-08-28 NOTE — Consult Note (Signed)
EGD showed somewhat irreg mucosa in distal esophagus c/w his known esoph cancer but no obstructions to lumen.  Stomach and duodenum looked fine.  Discussed with daughter.  Electronic Signatures: Manya Silvas (MD)  (Signed on 22-May-15 09:10)  Authored  Last Updated: 22-May-15 09:10 by Manya Silvas (MD)

## 2014-08-28 NOTE — Discharge Summary (Signed)
PATIENT NAME:  Chad Luna, CLOS MR#:  [id number] DATE OF BIRTH:  Oct 02, 1926  DATE OF ADMISSION:  08/15/2013 DATE OF DISCHARGE:  08/21/2013  FINAL DIAGNOSES: 1.  Pulmonary embolism.  2.  Acute respiratory failure secondary to #1.  3.  Esophageal cancer with recent chemotherapy regimen.  4.  Neutropenia with neutropenic fevers related to recent chemotherapy.  5.  Hypokalemia with hypomagnesemia.  6.  Azotemia.  7.  Right shoulder pain, consistent with rotator cuff tear.  8.  Hypertension.  9.  Benign prostatic hypertrophy.   HISTORY AND PHYSICAL: Please see dictated admission history and physical.   HOSPITAL COURSE: The patient was admitted with generalized weakness and fall, found to have evidence of pulmonary embolism with acute respiratory failure. Initially was placed on heparin drip secondary to the presence of esophageal cancer with recent chemotherapy. He did develop some fevers and was placed on neutropenic precautions and started empirically on Cipro. He was evaluated by GI as well as oncology. He was given Neupogen, and his white counts did respond rapidly to this. Diet was adjusted, the patient was hydrated.   He was taken off Cipro and remained afebrile with cultures negative. He tolerated heparin without evidence of significant bleeding and was changed over to Lovenox with Coumadin begun as well and tolerated this also.   He did have an episode where he developed vomiting, this was after he ate some potatoes. His diet was downgraded then slowly reintroduced. He tolerated this up to puree without difficulty.   Physical therapy worked with the patient and thought that he would benefit from working with continued exercise to gain strength and endurance. The patient also complained of pain in his right shoulder which he stated had been present for several weeks, although he did not mention this to about a week into his hospitalization. Films were unremarkable, and exam was consistent with  rotator cuff tear. Given his acute pulmonary embolism, recent chemotherapy, at this point conservative management was recommended and physical therapy will work with him for this as well. Due to his mobility issues as well as his arm pain, it was felt that he would benefit from short-term rehabilitation and he was in agreement. A bed search began and a bed became available and the patient will be transferred to the nursing home facility in stable condition with his physical activity to be up with a walker with assistance as tolerated. It was recommended that MET-B, CBC, and INR be done stat 2 days after admission. He will follow up with Dr. Grayland Ormond in 1 to 2 weeks. He should follow a pureed regular diet. Oxygen should be 3 liters nasal cannula continuously.   DISCHARGE MEDICATIONS: 1.  Allopurinol 100 mg p.o. daily.  2.  Vitamin B12 500 mcg p.o. daily.  3.  Lasix 40 mg p.o. daily as needed for edema.  4.  Combivent 1 puff 4 times a day as needed for shortness of breath.  5.  Colace 100 mg p.o. daily.  6.  Tamsulosin 0.4 mg p.o. daily.  7.  Vitamin D3 2000 units p.o. daily. 8.  Potassium 10 mEq p.o. daily as needed with furosemide. 9.  Lisinopril 5 mg p.o. daily.  10.  Symbicort 160/4.5 two puffs b.i.d.  11.  Reglan 10 mg p.o. 4 times a day.  12.  Diltiazem CD 240 mg p.o. daily. 13.  Pantoprazole 40 mg p.o. b.i.d.  14.  Lovenox 80 mg subcu b.i.d., to be stopped when INR greater than 2. 15.  Coumadin 3 mg p.o. daily.  16.  Ondansetron 4 mg p.o. t.i.d. p.r.n. nausea.   NOTE: The patient should stop aspirin.   CODE STATUS: The patient is DO NOT RESUSCITATE. An out of facility DNR order was sent with him to the facility.  ____________________________ Adin Hector, MD bjk:sb D: 08/21/2013 13:04:46 ET T: 08/21/2013 13:28:33 ET JOB#: [id number]  cc: Tama High III, MD, <Dictator> Ramonita Lab MD ELECTRONICALLY SIGNED 08/25/2013 8:17

## 2014-08-28 NOTE — Op Note (Signed)
PATIENT NAME:  Chad Luna, Chad Luna MR#:  [id number] DATE OF BIRTH:  1926-11-12  DATE OF PROCEDURE:  09/22/2013  PREOPERATIVE DIAGNOSES: 1.  Multiple pulmonary emboli.  2.  Gastrointestinal bleed.  3.  Gastric cancer.   POSTOPERATIVE DIAGNOSES: 1.  Multiple pulmonary emboli.  2.  Gastrointestinal bleed.  3.  Gastric cancer.   PROCEDURE PERFORMED:   1.  Inferior venacavogram.  2.  Placement of infrarenal inferior vena caval filter, Bard Meridian type.   SURGEON: Katha Cabal, M.D.   SEDATION: Versed 2 mg intravenous.   CONTRAST USED: Isovue 20 mL.   FLUOROSCOPY TIME: 2.2 minutes.   INDICATIONS: Chad Luna is an 81 year old gentleman who has a history of gastric cancer and subsequently has developed multiple pulmonary emboli. He was initiated on anticoagulation but presents on this admission with a GI bleed. Risks and benefits for insertion of the filter were reviewed. Discussion regarding end-of-life scenarios, that is should filter be pursued in light of other circumstances, were also discussed. The patient does wish to move forward with filter placement for protection from lethal pulmonary emboli in the immediate future.   DESCRIPTION OF PROCEDURE: The patient is taken to special procedures and placed in the supine position. After adequate sedation with intravenous Versed has been achieved, the right groin is infiltrated with lidocaine. Ultrasound is placed in a sterile sleeve. Femoral vein is identified. It is echolucent and compressible, indicating patency. Image is recorded for the permanent record. Under real-time visualization, a microneedle is inserted, microwire followed by microsheath. J-wire is then advanced through the microsheath, however, the J-wire hangs up in the mid pelvis. An 11 cm, 6-French sheath is then advanced over the wire and hand injection of contrast is used to demonstrate the venous anatomy from a femoral approach. It appears the vein has mild compression and  significant tortuosity secondary to the arterial aneurysmal disease the patient has which has been previously treated with a stent graft. However, there appears to be  external compression and alteration of the normal anatomy based on this arterial aneurysm. Once this is identified, the wire actually  is negotiated with a Kumpe catheter into the inferior vena cava and then up to the atrium. Delivery sheath is then advanced over the wire. It is somewhat difficult to get around the confluence but it is positioned just above the confluence at the L4 level. Bolus injection of contrast is then used to demonstrate the inferior vena cava and after review of the images and measuring of the cava, the wire is reintroduced and the sheath is advanced to the L2 level.   Meridian filter is then advanced through the sheath and deployed at the L2 level just below the left renal vein without difficulty. The filter is in good orientation. The sheath is removed, pressure is held and a safeguard is placed.   INTERPRETATION: Inferior vena cava was opacified as noted above. There is some alteration of the normal anatomy of the iliacs but, once the inferior vena cava has been imaged, it is patent. There are no filling defects. It measures approximately 21 mm in diameter and there is reflux of contrast into the left renal vein at just above L2. Filter is deployed just below that level without difficulty.   SUMMARY: Successful placement of an inferior vena caval filter, infrarenal, as noted above.    ____________________________ Katha Cabal, MD ggs:cs D: 09/23/2013 18:48:00 ET T: 09/23/2013 20:22:52 ET JOB#: [id number]  cc: Katha Cabal, MD, <Dictator> [place]  MD ELECTRONICALLY SIGNED 10/06/2013 12:28

## 2014-08-28 NOTE — Consult Note (Signed)
EGD results noted. Appreciate GI input. Once patient's acute issues resolve, will have him followup in the [place] as an outpatient to discuss reinitiating chemotherapy if he so desires. No acute oncology needs at this time. consult.  Electronic Signatures: Delight Hoh (MD)  (Signed on 22-May-15 12:35)  Authored  Last Updated: 22-May-15 12:35 by Delight Hoh (MD)

## 2014-08-28 NOTE — Discharge Summary (Signed)
PATIENT NAME:  Chad Luna, Chad Luna MR#:  [id number] DATE OF BIRTH:  Sep 24, 1926  DATE OF ADMISSION:  08/24/2013 DATE OF DISCHARGE:  08/29/2013   FINAL DIAGNOSES:  1. Pneumococcal pneumonia.  2. Sepsis and bacteremia secondary to #1.  3. Recent pulmonary embolism.  4. Chronic respiratory failure.  5. Recurrent esophageal cancer.  6. Chronic anemia secondary to esophageal cancer.  7. Chronic kidney disease, stage III.  8. Degenerative joint disease with right shoulder pain, with probable rotator cuff tear, conservative care recommended.  9. Benign prostatic hypertrophy.  10. Hemorrhoids.   HISTORY AND PHYSICAL: Please see dictated admission history and physical.   [place]: The patient was admitted with evidence of pneumonia and sepsis. Blood cultures grew out pneumococcus, which was thought to be of lung in origin. He was placed on antibiotics, IV support was supplied, and he was taken off his blood pressure medications. With this combination, he has showed slow and steady improvement. He had recently been hospitalized with pulmonary embolism, and had been sent out on Lovenox, Coumadin. His INR was high throughout his hospitalization, and so his Coumadin was just restarted on the day of discharge.   He was continued on inhaled medications for COPD, and did not have any significant wheezing. He did have episodes of nausea, which is something that he has periodically; Reglan was restarted for this. He had some flare-ups of his hemorrhoids, and this was treated topically.   Lisinopril and Cardizem have been held since he was admitted secondary to hypotension; these may need to be restarted at the skilled nursing facility if his blood pressure begins to rise.   At this time, he is discharged to a skilled nursing facility in stable condition. His physical activity should be up with a walker with assistance. He should be on a 2 gram sodium, pureed diet. He will be on 4 liters of nasal  cannula continuously. Physical therapy and occupational therapy should evaluate and treat the patient. He will need a MET-B, CBC and INR drawn in 2 days, with results to the nursing home physician.   DISCHARGE MEDICATIONS:  1. Allopurinol 100 mg p.o. daily for gout.  2. Vitamin B12 500 mcg p.o. daily.  3. Vitamin D 2000 units p.o. daily.  4. Symbicort 160/4.5 two puffs inhaled b.i.d.  5. Pantoprazole 40 mg p.o. b.i.d.  6. Lasix 40 mg p.o. daily as needed for edema.  7. Potassium 10 mEq p.o. daily as needed for edema when taking Lasix.  8. Zofran 4 mg orally p.o. q.8 hours as needed for nausea or vomiting.  9. Combivent Respimat 1 puff every 6 hours as needed.  10. Colace 100 mg p.o. at bedtime.  11. Flomax 0.4 mg p.o. at bedtime.  12. Coumadin 2 mg p.o. daily. 13. Reglan 10 mg p.o. t.i.d. before meals.  14. Levaquin 500 mg p.o. daily for 10 days.  15. Hydrocortisone cream rectally apply b.i.d. p.r.n. hemorrhoids.   Lisinopril 5 mg daily and Cardizem CD 240 mg daily were both held, as noted above.   CODE STATUS: The patient was do not resuscitate during this hospitalization, and a DNR form is sent with him to the facility.   TIME OF THIS DISCHARGE: 30 minutes.   ____________________________ Adin Hector, MD bjk:lb D: 08/29/2013 11:27:13 ET T: 08/29/2013 11:52:50 ET JOB#: [id number]  cc: Adin Hector, MD, <Dictator> Ramonita Lab MD ELECTRONICALLY SIGNED 08/31/2013 7:37

## 2014-08-28 NOTE — H&P (Signed)
PATIENT NAME:  Chad Luna, Chad Luna MR#:  [id number] DATE OF BIRTH:  Jul 02, 1926  DATE OF ADMISSION:  08/15/2013  PRIMARY CARE PHYSICIAN: Adin Hector, MD  ONCOLOGIST: Kathlene November. Grayland Ormond, MD  CHIEF COMPLAINT: "I had a fall and couldn't get up."   HISTORY OF PRESENT ILLNESS: This is an 81 year old man who is undergoing treatment for esophageal cancer, last chemotherapy 2 weeks ago. He has intermittent nausea, vomiting, spitting up white frothy phlegm. He has not been able to eat too much lately. Today he was fixing himself breakfast, and he had a fall and he could not get up. He hit his right hand and left elbow. He does not have any energy. In the ER, they did a CT scan of the chest that showed a lot of chronic changes and an acute pulmonary arterial embolism. Hospitalist services were contacted for further evaluation. The patient in the ER was tachycardic   PAST MEDICAL HISTORY: Esophageal cancer undergoing chemotherapy, hemorrhoids, hypertension, BPH.   PAST SURGICAL HISTORY: Hernia, a stent in the aorta for aneurysm repair, tonsils, hip fracture, arm surgery.   ALLERGIES: No known drug allergies.   MEDICATIONS: List is still being reconciled at this time.   SOCIAL HISTORY: The patient lives alone. Quit smoking 20+ years ago. No alcohol. No drug use. Used to work in [employer].   FAMILY HISTORY: As per old chart, prostate cancer in the family.   REVIEW OF SYSTEMS:    CONSTITUTIONAL: Positive for cold feeling. Positive for 30-pound weight loss over the past 3 months. No fever or chills. Positive for weakness.  EYES: His vision is not good starting today.  EARS, NOSE, MOUTH, AND THROAT: No hearing loss. No sore throat. No difficulty swallowing.  CARDIOVASCULAR: Positive for slight chest pain.  RESPIRATORY: No shortness of breath. Some cough. No sputum. No hemoptysis.  GASTROINTESTINAL: Positive for nausea, vomiting, sometimes frothy whitish phlegm. Positive for constipation. No abdominal  pain. No bright red blood per rectum. No melena.  GENITOURINARY: No burning on urination. No hematuria.  MUSCULOSKELETAL: Positive for right arm pain.  INTEGUMENTARY: No rashes or eruptions.  NEUROLOGIC: No fainting or blackouts, but a fall today. Positive for generalized weakness.  PSYCHIATRIC: No anxiety or depression.  ENDOCRINE: No thyroid problems.  HEMATOLOGIC AND LYMPHATIC: History of anemia.   PHYSICAL EXAMINATION: VITAL SIGNS: Temperature 98.5, pulse 122, respirations 20, blood pressure 168/101, pulse oximetry 93% on room air.  GENERAL: No respiratory distress, lying flat in bed.  EYES: Conjunctivae and lids normal. Pupils equal, round, and reactive to light. Extraocular muscles intact. No nystagmus.  EARS, NOSE, MOUTH, AND THROAT: Tympanic membranes: No erythema. Nasal mucosa: No erythema. Throat: No erythema, no exudate seen. Lips and gums: No lesions.  NECK: No JVD. No bruits. No lymphadenopathy. No thyromegaly. No thyroid nodules palpated.  RESPIRATORY: Decreased breath sounds bilateral bases. No rhonchi, rales, or wheeze heard.  CARDIOVASCULAR: S1, S2, tachycardic. No gallops, rubs, or murmurs heard. Carotid upstroke 2+ bilaterally. No bruits. Dorsalis pedis pulses 1+ bilaterally, 3+ edema of the lower extremity.  ABDOMEN: Soft, nontender. No organomegaly/splenomegaly. Normoactive bowel sounds. No masses felt.  LYMPHATIC: No lymph nodes in the neck.  MUSCULOSKELETAL: Shows 3+ edema. No clubbing. No cyanosis.  SKIN: No ulcers or lesions.  NEUROLOGIC: Cranial nerves II through XII grossly intact. Deep tendon reflexes 1+ bilateral lower extremities.  PSYCHIATRIC: The patient is alert, oriented to person, place, and time.   LABORATORY AND RADIOLOGICAL DATA: EKG showed sinus tachycardia, left axis deviation, right bundle  branch block, inferior infarct. Urinalysis negative. CT scan of the chest showed a pulmonary embolism. CT scan of the head: Moderate diffuse cortical atrophy. Chest  x-ray showed stable bilateral lower lung zone opacities when compared to October 2014. CPK 106. White blood cell count 2.8, hemoglobin and hematocrit 10.5 and 33.5. Glucose 149, BUN 19, creatinine 1.25, sodium 141, potassium 3.6, chloride 108, CO2 of 29, calcium 8.3. Liver function tests: Albumin low at 2.6. GFR 53. Troponin borderline at 0.1.   ASSESSMENT AND PLAN: 1.  Pulmonary embolism: With the patient's history of esophageal cancer, I looked at a prior endoscopy done last year that showed friable lesion in the esophagus with contact bleeding. I am very concerned with blood thinner that this patient may bleed. I will get an oncology consultation. I will put the patient on heparin drip for right now; just in case if he does bleed, we can turn it off. I will get an ultrasound of the lower extremities to rule out deep vein thrombosis and an echocardiogram to see if there is right heart strain.  2.  Weakness and fall: Likely secondary to chemotherapy. Safety at home is a question. Will get a physical therapy evaluation. The patient may end up needing rehab.  3.  Esophageal cancer: As per the family members, he has gone through quite a bit with chemotherapy over the last few years. He has had some intermittent vomiting and spitting up frothy phlegm. Will have oncology talk about prognosis. The patient is a DO NOT RESUSCITATE. I will get a GI consult speech and a speech and swallow evaluation. Will put the patient on a soft diet with pureed meats.  4.  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 Continue his usual medications.  5.  Benign prostatic hypertrophy.  6.  Elevated troponin: Likely secondary to pulmonary embolism. Will put on off-unit telemetry and get serial cardiac enzymes and an echocardiogram.   TIME SPENT ON ADMISSION: 55 minutes.   CODE STATUS: The patient is a DNR.    ____________________________ Tana Conch. Leslye Peer,  MD rjw:jcm D: 08/15/2013 17:37:05 ET T: 08/15/2013 18:09:50 ET JOB#: [id number]  cc: Tana Conch. Leslye Peer, MD, <Dictator> Adin Hector, MD Kathlene November. Grayland Ormond, MD  Marisue Brooklyn MD ELECTRONICALLY SIGNED 08/16/2013 19:23

## 2014-08-28 NOTE — Consult Note (Signed)
Brief Consult Note: Diagnosis: Right shoulder pain.   Patient was seen by consultant.   Consult note dictated.   Recommend further assessment or treatment.   Orders entered.   Discussed with Attending MD.   Comments: 81 year old male fell about a month ago injuring the right shoulder.  Has some pain and weakness.  X-rays show arthritis but no fracture.    Exam:  No swelling or bruising.  circulation/sensation/motor function good distally.  Some weakness of abduction.  Mild crepitus.    X-rays: as above]  Imp:  sprian of right shoulder.  Possible rotator cuff damage.  Rx: Sling, heat. pain meds if needed.         Probably not a surgical candidate.        May benefit from Physical Therapy.       return to clinic 2-3 weeks if symptomatic.Marland Kitchen  Electronic Signatures: Park Breed (MD)  (Signed 17-Apr-15 14:05)  Authored: Brief Consult Note   Last Updated: 17-Apr-15 14:05 by Park Breed (MD)

## 2014-08-28 NOTE — H&P (Signed)
PATIENT NAME:  Chad Luna, Chad Luna MR#:  [id number] DATE OF BIRTH:  [birth year]  DATE OF ADMISSION:  09/21/2013  REFERRING PHYSICIAN: Dr. Marta Antu.  PRIMARY CARE PHYSICIAN: Dr. Ramonita Lab.   CHIEF COMPLAINT: Bright red blood per rectum.   HISTORY OF PRESENT ILLNESS: This is a 81 year old male who was recently sent from [facility] on 04/25 after he was admitted for bacteremia secondary to pneumonia. The patient was discharged to subacute rehab. The patient presents with subacute rehab for rectal bleeding. The patient is hard of hearing, but he is awake, alert,  with it. He reports he has been having bright red blood per rectum for the last two days. Reports it has been minimal amount, resemble hemorrhoidal bleed in the past. The patient denies any history of GI bleed in the past, besides his hemorrhoidal bleed. Denies any nausea, any vomiting, any coffee-ground emesis, the patient was on anticoagulation for recent diagnosis of PE. The patient  is being transitioned from warfarin to Xarelto at the nursing home, as per reviewing nursing home records it appears ago the Xarelto has not been started yet. It should have been started on 05/19 as the patient was on warfarin with most recent INR over there was 4.2, where it is currently being held until its subtherapeutic when he can be started on Xarelto. Today the patient's warfarin is 2.3. The patient had bright red blood at his diapers. ED physician upon rectal exam noticed the bright red blood per rectum. Upon my exam I could not see any bright red blood per rectum, the patient has significant external hemorrhoid. The patient's hemoglobin was found to be stable at 10.6. Actually is better than his baseline, the patient's hemoglobin was 7.8, today is 10.5. Hospitalist service was requested to admit the patient for further evaluation. As well, the patient was found to have mildly elevated creatinine of being 1.69, baseline upon discharge was 1.1.   PAST  MEDICAL HISTORY: 1. Esophageal cancer undergoing chemotherapy.  2. Hemorrhoids.  3. Hypertension.  4. Benign prostatic hypertrophy.  5. Pulmonary embolism.   PAST SURGICAL HISTORY:  1. Hernia.  2. Stent in the aorta for aneurysm repair.  3. Tonsillectomy.  4. Hip fracture surgery.  5. Hemorrhoidectomy.   ALLERGIES: No known drug allergies.   HOME MEDICATIONS: The patient was discharged on these medications on 04/25:  1. Allopurinol 100 mg oral daily.  2. Vitamin B12 100 mcg daily.  3. Vitamin D 2000 units daily.  4. Symbicort 160/4.5, 2 puffs inhalational b.i.d.  5. Pantoprazole 40 mg b.i.d.  6. Lasix 40 mg daily as needed.  7. Potassium 10 mEq oral daily as needed.  8. Zofran 4 mg as needed.  9. Combivent 1 puff every six hours as needed.  10. Colace  100 mg oral at bedtime.  11. Flomax 0.4 mg at bedtime.  12. Warfarin 2 mg p.o. daily, but this since has been held since 05/11. He has not been any anticoagulation since then and it seems per nursing home records his most recent INR 4.2, with no clear date on it, as well seems he should be started on Xarelto on 05/19, which he has not started yet.  13. Reglan 10 mg oral 3 times a day after meals.  14. Hydrocortisone cream as needed for hemorrhoids.   SOCIAL HISTORY: The patient usually lives at home, but currently he is in subacute rehab. He lives in [place]. The patient quit smoking 20 years ago and  no alcohol.  REVIEW OF SYSTEMS:  CONSTITUTIONAL:  Denies any fever, chills, fatigue, weakness.  EYES: Denies blurry vision, double vision or inflammation.  ENT: The patient is hard of hearing. Denies any ear pain, epistaxis or discharge.  RESPIRATORY: Denies cough, wheezing, hemoptysis, shortness of breath.  CARDIOVASCULAR: Denies chest pain, edema, palpitation, syncope.   GASTROINTESTINAL: Denies nausea, vomiting, diarrhea, abdominal pain, hematemesis, melena. Reports of bright red blood per rectum.  GENITOURINARY:  Denies dysuria, hematuria, or renal colic.  ENDOCRINE: Denies polyuria, polydipsia, heat or cold intolerance.  HEMATOLOGY: Denies anemia, easy bruising, bleeding diathesis. Reports history of PE in April.  INTEGUMENTARY: Denies acne, rash or skin lesion.  MUSCULOSKELETAL: Denies any swelling, gout, cramps.  NEUROLOGIC: Denies CVA, TIA, tremors, ataxia or vertigo.  PSYCHIATRIC: Denies anxiety, insomnia, or depression.   PHYSICAL EXAMINATION: VITAL SIGNS: Temperature 98.5, pulse 104, respiratory rate 18, blood pressure 134/76, saturating 97% on oxygen.  GENERAL: Elderly frail male who looks comfortable in bed, in no apparent distress.  HEENT: Head atraumatic, normocephalic. Pupils equal, reactive to light. Pink conjunctivae. Anicteric sclerae. Dry oral mucosa.  NECK: Supple. No thyromegaly. No JVD.  CHEST: Good air entry bilaterally. No wheezing, rales, rhonchi.  CARDIOVASCULAR: S1, S2 heard. No rubs, murmurs, or gallops.  ABDOMEN: Soft, nontender, nondistended. Bowel sounds present.  EXTREMITIES: No edema. No clubbing. No cyanosis. pedal pulses felt bilaterally.  PSYCHIATRIC: Appropriate affect. Awake, alert x 3. Intact judgment and insight. NEUROLOGICAL: Cranial nerves grossly intact. The patient is hard of hearing. No focal deficits.  SKIN: Normal skin turgor. Warm and dry.  MUSCULOSKELETAL: No joint effusion or erythema.  RECTAL: The patient had external hemorrhoid grade 3 without any significant evidence of bleeding. Rectal exam was done which shows stools in the vault, but no one evidence of bright red blood, but the patient on his diapers a few spots of blood.   PERTINENT LABORATORY DATA: Glucose 100, BUN 24, creatinine 1.69, sodium 143, potassium 3.42, CO2 37, ALT 15, AST 27, alkaline phosphatase 76, INR 7.2, hemoglobin 10.5, hematocrit 33, platelets 147. INR 2.3.   ASSESSMENT AND PLAN: 1. Bright red blood per rectum, unclear if this is related to his hemorrhoidal bleed or not, but  his vital signs appeared to be stable. His hemoglobin appears to be stable, but given the fact he is on anticoagulation with INR of 2.3 the patient will be admitted for observation. We will monitor his hemoglobin every six hours and we will keep active type and screen on him. We will consult gastroenterology service to see if there is any further work-up needed at this point and if he is clear to resume his anticoagulation prior to discharge. We will continue to hold warfarin for the time being and we will keep him on a full liquid diet.  2. Acute renal failure. The patient appears to be dehydrated. We will have him on IV fluids. We will hold his Lasix.  3. Hypokalemia, mild. We will replace.  4. History of esophageal cancer. The patient undergoing chemotherapy. He will continue to follow as an outpatient.  5. Hypertension. Blood pressure is acceptable, but the patient does not appear to be on hypertensive any medications. We will monitor.  6. Benign prostatic hypertrophy. Continue with Flomax.  7. History of pulmonary embolism. INR is currently therapeutic. We will hold his warfarin until he is seen by gastroenterology, it appears to be the patient is being planned to be switched to Xarelto as an outpatient. I would not recommend that as warfarin is much easier  to reverse in case the patient has episode of gastrointestinal bleed.  8. Deep vein thrombosis prophylaxis. The patient has therapeutic INR.  9. CODE STATUS: Discussed with daughter and the patient. He has DO NOT RESUSCITATE form, and they confirmed that.   TOTAL TIME SPENT ON ADMISSION AND PATIENT CARE: 50 minutes.    ____________________________ Albertine Patricia, MD dse:sg D: 09/21/2013 07:14:00 ET T: 09/21/2013 08:33:41 ET JOB#: [id number]  cc: Albertine Patricia, MD, <Dictator> Drake Wuertz Graciela Husbands MD ELECTRONICALLY SIGNED 09/23/2013 5:34

## 2014-08-28 NOTE — Consult Note (Signed)
Chief Complaint:  Subjective/Chief Complaint Cross cover for Dr. Elliott. The patient reports post prandial nausea nd vomuiting. Says he does well with breakfast and lunch but has problems mostly aty dinner. Says it started again last night and has presistend until this am. Now with coughing up large amounts of brown sputum. Patient has a history of esophageal cancer. EGD 3 days ago with finding consistent with his history of esophageal cancer.   VITAL SIGNS/ANCILLARY NOTES: **Vital Signs.:   25-May-15 08:16  Vital Signs Type Q 8hr  Temperature Temperature (F) 98.7  Celsius 37  Temperature Source oral  Pulse Pulse 78  Respirations Respirations 17  Systolic BP Systolic BP 148  Diastolic BP (mmHg) Diastolic BP (mmHg) 77  Mean BP 100  Pulse Ox % Pulse Ox % 95  Pulse Ox Activity Level  At rest  Oxygen Delivery 1L   Brief Assessment:  GEN well developed, well nourished, no acute distress   Respiratory Cough   Gastrointestinal Normal   Additional Physical Exam Alert and orientated times 3   Lab Results: Routine Chem:  25-May-15 04:37   Glucose, Serum 87  BUN 14  Creatinine (comp)  1.35  Sodium, Serum 142  Potassium, Serum 3.7  Chloride, Serum 105  CO2, Serum  36  Calcium (Total), Serum 8.9  Anion Gap  1  Osmolality (calc) 283  eGFR (African American)  55  eGFR (Non-African American)  48 (eGFR values <60mL/min/1.73 m2 may be an indication of chronic kidney disease (CKD). Calculated eGFR is useful in patients with stable renal function. The eGFR calculation will not be reliable in acutely ill patients when serum creatinine is changing rapidly. It is not useful in  patients on dialysis. The eGFR calculation may not be applicable to patients at the low and high extremes of body sizes, pregnant women, and vegetarians.)  Result Comment LABS - This specimen was collected through an   - indwelling catheter or arterial line.  - A minimum of 5mls of blood was wasted prior    -  to collecting the sample.  Interpret  - results with caution.  Result(s) reported on 28 Sep 2013 at 05:00AM.  Routine Hem:  25-May-15 04:37   WBC (CBC) 5.6  RBC (CBC)  3.39  Hemoglobin (CBC)  9.8  Hematocrit (CBC)  31.0  Platelet Count (CBC)  147  MCV 91  MCH 28.9  MCHC  31.6  RDW  21.1  Neutrophil % 56.4  Lymphocyte % 24.5  Monocyte % 11.2  Eosinophil % 5.3  Basophil % 2.6  Neutrophil # 3.1  Lymphocyte # 1.4  Monocyte # 0.6  Eosinophil # 0.3  Basophil # 0.1   Assessment/Plan:  Assessment/Plan:  Assessment Nausea and vomiting mostly with evening meal but has presistent since yesterday.   Plan Multiple possibilities may be the cause of his symptoms. He still has his gall bladder and this may be related to a poorly functioning gall bladder even though he denies pain. Consider a RUQ ultrasound. Brain mets could also account for some of his symptoms with his history of esophageal cancer. Consider a Head CT. Reflux can also lead to nausea and vomiting but he is already on a PPI bid. The patients esophageal cancer can also irritate the esophagus and cause his symptoms. Dr. Elliott to resume care tomorrow.   Electronic Signatures: Wohl, Darren (MD)  (Signed 25-May-15 10:16)  Authored: Chief Complaint, VITAL SIGNS/ANCILLARY NOTES, Brief Assessment, Lab Results, Assessment/Plan   Last Updated: 25-May-15 10:16 by Wohl, Darren (  MD)

## 2014-08-28 NOTE — H&P (Signed)
PATIENT NAME:  Chad Luna, Chad Luna MR#:  [id number] DATE OF BIRTH:  1927-01-22  DATE OF ADMISSION:  08/24/2013  PRIMARY CARE PHYSICIAN: Dr. Ramonita Lab  CHIEF COMPLAINT: Shortness of breath and drowsiness.   HISTORY OF PRESENT ILLNESS: An 81 year old male who was admitted to hospital on 06/17/2013 and discharged on the 08/21/2013 for pulmonary embolism. He has a history of esophageal cancer and is chemotherapy. After diagnosed with PE, he was discharged to [facility] with subcutaneous Lovenox and Coumadin on 17th with oxygen. Before he was diagnosed with PE he never required oxygen at home. He was living at home and independent, not driving, but able to do his day-to-day activity. Today history obtained from his daughter, who is present in the room. For last 3 days since he was sent to [facility] he has gradual worsening in his respiratory status. He was discharged with 3 liters of oxygen over there and he was using it constantly but then continuously his oxygen level was dropping. Today it was noticed to be 84% even being on 3 liters and he was noticed being a little more lethargic, so sent to hospital from [facility]. In the ER, he was initially started on nonrebreather mask, but later on he responded well and he is on 4 to 5 liters, now saturating more than 90%. On chest x-ray he was found having new onset right-sided pneumonia. His kidney function got a little worse so CT scan of the chest is not obtained and started on treatment for pneumonia, given for admission.  REVIEW OF SYSTEMS: Unable to get as the patient is somewhat lethargic but arousable and has hearing issues. History obtained from patient's daughter.   PAST MEDICAL HISTORY:  1. Esophageal cancer undergoing chemotherapy.  2. Hemorrhoid. 3. Hypertension. 4. BPH. 5. Pulmonary embolism, recently.   PAST SURGICAL HISTORY: Hernia, stent in the aorta for aneurysm repair, tonsils, hip fracture surgery.   ALLERGIES: No known drug  allergies.   SOCIAL HISTORY: The patient lives alone until last week and after being diagnosed with PE, he was sent to [facility]. He was a smoker, quit 20 years ago. No alcohol. No drug use. He used to work in [employer], retired.   FAMILY HISTORY: Prostate cancer in the family.   MEDICATIONS: At nursing home:  1. Zofran 4 mg oral tablet every 8 hours.  2. Vitamin D3 at 2000 international units once a day.  3. Vitamin B12 at 500 mcg once a day.  4. Symbicort 2 puffs inhalation 2 times a day.  5. Protonix 40 mg oral 2 times a day.  6. Potassium chloride 10 mEq every 24 hours. 7. Metoclopramide 10 mg oral tablet 4 times a day.  8. Lisinopril 5 mg oral tablet once a day.  9. Lasix 40 mg oral every 24 hours.  10. Flomax 0.4 mg oral capsule once a day.  11. Docusate sodium 100 mg oral once a day.  12. Coumadin 3 mg oral tablet once a day.  13. Cardizem 240 mg extended-release tablet once a day.  14. Allopurinol 100 mg oral tablet once a day.   PHYSICAL EXAMINATION:  VITAL SIGNS: In the ER on presentation, temperature 98.4, pulse 93, respirations 28, blood pressure was 127/68 but during my examination it is 89/54. Oxygen saturation 88 with 3 liters of oxygen supplementation, increased to 4 to 5 and it came up to 90%.  GENERAL: The patient is alert but somewhat confused and hard of hearing and a little bit drowsy. HEENT:  Head is normocephalic, atraumatic. Conjunctiva pink. Oral mucosa moist.  NECK: Supple. No JVD.  RESPIRATORY: Crepitations present, more on right side. Equal air entry.  CARDIOVASCULAR: S1, S2, present, regular. No murmur.  ABDOMEN: Soft, nontender. Bowel sounds present. No organomegaly.  SKIN: No rashes.  EXTREMITIES: Legs: No edema.  NEUROLOGIC: Power 4/5. Generalized weakness. Follows simple commands. PSYCHIATRIC: Unable to assess as patient is hard of hearing and little bit drowsy.  IMPORTANT LABORATORY RESULTS: Chest x-ray, PA and lateral, shows increased right  basilar infiltrate associated with effusion when compared with prior examination. Glucose is 128, BUN 23, creatinine 1.56, sodium 139, potassium is 4.2, chloride is 108, and CO2 is 26. Calcium is 7.9. Total protein is 5.6, bilirubin 0.5, AST is 23 and ALT is 21. Troponin is 0.07. WBC is 30,000, hemoglobin 8.7, platelet count 214, MCV is 88. INR is 3.6.   ASSESSMENT AND PLAN: An 81 year old male with a past medical history of esophageal undergoing chemotherapy, pulmonary embolism, recently diagnosed, hypertension, and hemorrhoid who was discharged to nursing home and sent with worsening respiratory status, found having pneumonia in emergency room.   1. Sepsis secondary to pneumonia. It is evident by lethargy, hypoxia, hypotension, leukocytosis. As he was in hospital last week, we will give him broad-spectrum antibiotics to cover healthcare associated pneumonia, get sputum culture and blood culture.  2. Pulmonary embolism recent. He was discharged on Lovenox and Coumadin. His INR is 3.6, so I would like to hold it and we will recheck it tomorrow morning. May need to restart it later on.   3. Hypertension. Was taking blood pressure medications at home, but currently he is hypotensive due to sepsis and will have to hold all the medications. We would like to give him some IV fluid to help with blood pressures.  4. Acute renal failure, creatinine is 1.56. May be due to her decreased intake as patient was short of breath and somewhat lethargic. We would give IV fluid and follow it tomorrow.  5. Esophageal cancer undergoing chemotherapy. Currently it is on hold because of the patient's admission to hospital. We have to do it once the patient is stable to get it. 6. Benign prostatic hypertrophy. He was taking Flomax. Would like to hold it at this time because of hypotension.  7. Acute respiratory failure with worsening. Before last week before he was diagnosed with pulmonary embolism, he was not requiring any  supplemental oxygen. He was discharged to a nursing home 3 days ago with 3 liters oxygen supplementation. Currently, he is requiring more and getting only up to 84% on 3 liters oxygen supplementation. So this is an acute worsening of his respiratory failure. We will continue treating underlying cause, pulmonary embolism and pneumonia.   CODE STATUS: DNR, confirmed with his healthcare power of attorney, his daughter, Miss Haynes Kerns, who is present in the room. I explained to her about possibilities of further worsening of respiratory status and mental status. She understands and agrees with continuing current medical management but no intubation or CPR.  CONDITION: Critical because of sepsis presentation.   TOTAL TIME SPENT ON THIS ADMISSION: Critical care 50 minutes.    ____________________________ Ceasar Lund Anselm Jungling, MD vgv:lt D: 08/24/2013 21:18:40 ET T: 08/24/2013 22:42:46 ET JOB#: [id number]  cc: Ceasar Lund. Anselm Jungling, MD, <Dictator> Adin Hector, MD Vaughan Basta MD ELECTRONICALLY SIGNED 08/30/2013 13:15

## 2014-08-28 NOTE — Discharge Summary (Signed)
PATIENT NAME:  Chad Luna, Chad Luna MR#:  [id number] DATE OF BIRTH:  1926-11-13  DATE OF ADMISSION:  09/30/2013 DATE OF DISCHARGE:  10/02/2013   FINAL DIAGNOSES:  1. Lower gastrointestinal bleeding.  2. Esophageal cancer.  3. Chronic obstructive pulmonary disease with chronic respiratory failure.  4. Chronic kidney disease stage III.  5. Osteoarthritis.  6. Metabolic encephalopathy.   HISTORY AND PHYSICAL: Please see dictated admission history and physical.   HOSPITAL COURSE: The patient was admitted with recurrence of GI bleeding. He had had a pulmonary embolism in April 2015, and originally when he had come in, he had been on Coumadin with a high INR. This had been held, and he had been sent out on Lovenox to see whether he would have recurrent bleeding. Unfortunately, he had this recur, and so was taken off Lovenox, but continued to have bleeding. He was confused when he came in as well, which appeared to be consistent with metabolic encephalopathy.   His mental status did seem to improve. His blood counts drifted down slightly with acute blood loss anemia as well as anemia of chronic disease. His respiratory status appeared to be stable.   Multiple discussions have been held with the patient and family members on several occasions with palliative care and by myself regarding his overall continued decline and the fact that it did not appear that he would be a candidate for any further chemotherapy, radiation therapy, really any other major intervention for his esophagus due to his clinical deterioration. The patient had always put this in the hands of his daughters. Palliative care talked to them again, and they had reached a point where they recognized that as he is hospitalized yet again, that he is to the point now where he has a terminal, irreversible and incurable condition with multiple other comorbidities and for which hospice would be a better plan of action. They were in agreement, and so the  patient will be moved to [facility] in stable condition. His physical activity will be up as tolerated, diet will be as tolerated. Further followup would be as per the [facility].   DISCHARGE MEDICATIONS:  1. DuoNeb SVNs 3 mL every 4 hours as needed for wheezing.  2. Symbicort 160/60 two puffs b.i.d.  3. Lasix 40 mg p.o. daily as needed for edema.  4. Lopressor 25 mg p.o. b.i.d., which may be held at the discretion of the hospice physician.  5. Allopurinol 100 mg p.o. daily.  6. Morphine concentrate 20 mg/mL 0.25 mL p.o. every 4 hours p.r.n. severe pain.   CODE STATUS: The patient is do not resuscitate, and an out-of-facility DNR order will be sent with him to the hospice facility.   ____________________________ Adin Hector, MD bjk:lb D: 10/02/2013 13:38:53 ET T: 10/02/2013 13:49:34 ET JOB#: [id number]  cc: Adin Hector, MD, <Dictator> Ramonita Lab MD ELECTRONICALLY SIGNED 10/07/2013 8:10

## 2014-08-28 NOTE — H&P (Signed)
PATIENT NAME:  Chad Luna, Chad Luna MR#:  [id number] DATE OF BIRTH:  05-18-1926  DATE OF ADMISSION:  09/30/2013  PRIMARY CARE PHYSICIAN: Dr. Ramonita Lab.  REFERRING PHYSICIAN: Dr. Carrie Mew.   CHIEF COMPLAINT: Bright red blood per rectum.   HISTORY OF PRESENT ILLNESS: Chad Luna is an 81 year old male who was recently diagnosed with pulmonary embolism, was admitted on 09/21/2013 through 09/29/2013. The patient was treated for lower GI bleed, however, did not undergo any colonoscopy. The patient had IVC filter placed and was started on Lovenox. Did not have any episodes of bleeding. This afternoon when the patient went to toilet, he noticed to have large amount of bright red blood. Concerning this, the patient was brought to the Emergency Department. The patient's hemoglobin was 9.8 prior to discharge. Current hemoglobin is 10.5. Denies having any abdominal pain, nausea. The patient has been having episodes of vomiting with the food what he has eaten. The patient has diagnosis of esophageal cancer, which was recurrent. However, recent  EGD was negative for any recurrence of the esophageal cancer, other than mild esophagitis. This was attributed to patient having cholelithiasis. The patient had extensive work-up. Multiple medications were attempted. The patient was started on scopolamine. Per daughter, the patient was noted to be confused. Chest x-ray done in the Emergency Department showed bibasilar atelectasis versus infiltrate. Concerning this, the patient was given one dose of levofloxacin in the Emergency Department.   PAST MEDICAL HISTORY: 1.  Esophageal cancer, recurrent.  2.  Hypertension.  3.  Hemorrhoids.  4.  Benign prostatic hypertrophy.  5.  Pulmonary embolism.  6.  COPD, oxygen-dependent.  7.  Peripheral vascular disease.  8.  Sleep apnea with CPAP.  9.  Atrial fibrillation.  10.  Osteoarthritis.  11.  History of bladder cancer.  12.  History of perforated peptic ulcer.   PAST  SURGICAL HISTORY:  1.  Perforated peptic ulcer repair.  2.  Cataract extraction.  3.  Carpal tunnel release.  4.  Left femur open reduction and internal fixation.  5.  Laparotomy.  6.  Coronary artery disease, status post stent placement.  7.  Hernia repair.  8.  Aortic aneurysm repair.   ALLERGIES: ERBITUX.  HOME MEDICATIONS: 1.  Vitamin D3, 1000 units once a day.  2.  Vitamin B12, 500 mcg once a day.  3.  Scopolamine patch every 3 days.  4.  Potassium chloride 10 mEq every 24 hours.  5.  Protonix 40 mg 2 times a day.  6.  Zofran 4 mg 3 times a day.  7.  Metoprolol 25 mg 2 times a day.  8.  Metoclopramide 5 mg 3 times a day.  9.  Lasix 40 mg once a day.  10.  Hydrocortisone suppository.  11.  Flomax 0.4 mg once a day.  12.  Lovenox 150 mg q.12 hours.  13.  Docusate sodium 100 mg 2 times a day.  14.  Symbicort 2 puffs 2 times a day.  15.  Allopurinol 100 mg once a day.  16.  Combivent 4 times a day.   SOCIAL HISTORY: No history of smoking. The patient is discharged to skilled nursing facility from here. Former smoker, quit 20 years back. No history of alcohol use.   FAMILY HISTORY: History of hypertension.   REVIEW OF SYSTEMS: CONSTITUTIONAL: Experiences generalized weakness, shortness of breath with walking.  EYES: No change in vision.  EARS, NOSE, THROAT: The patient is hard of hearing. RESPIRATORY: Has shortness of breath with walking.  CARDIOVASCULAR: No chest pain, palpations.  GASTROINTESTINAL: Has postprandial vomiting. No abdominal pain.  GENITOURINARY: No dysuria or hematuria.  ENDOCRINE: No polyuria or polydipsia.  HEMATOLOGIC: No easy bruising or bleeding, other than GI bleed today.  SKIN: No rash or lesions.  MUSCULOSKELETAL: No joint pains. The patient has osteoarthritis.  NEUROLOGIC: No weakness or numbness in any part of the body.   PHYSICAL EXAMINATION:  GENERAL: This is a well-built, well-nourished, age-appropriate male lying down in the bed, not in  distress.  VITAL SIGNS: Temperature 98, pulse 80, blood pressure 152/84, respiratory rate of 18, oxygen saturation 92% on 2 liters of oxygen.  HEENT: Head normocephalic, atraumatic. There is no scleral icterus. Conjunctivae normal. Pupils equal and reactive to light. Extraocular movements are intact. Mucous membranes moist. No pharyngeal erythema.  NECK: Supple. No lymphadenopathy. No JVD. No carotid bruit.  CHEST: Has no focal tenderness. Bilateral occasional wheezing. Mild coarse breath sounds bilaterally.  HEART: S1, S2 regular. No murmurs are heard.  ABDOMEN: Bowel sounds present. Soft, nontender, nondistended.  EXTREMITIES: 1 to 2+ pitting edema.  SKIN: No rash or lesions.  MUSCULOSKELETAL: Good range of motion in all the extremities.  NEUROLOGIC: The patient is alert, oriented to place, person, and time. Cranial nerves II through XII intact. Motor 5/5 in upper and lower extremities.   LABORATORY AND RADIOLOGICAL DATA: CMP is completely within normal limits.   CBC: WBC of 5.7, hemoglobin 10.6, platelet count of 148.   Coag profile: PT 13, INR of 1.   CT head without contrast: No acute intracranial abnormality.   Chest x-ray, one view portable: Bibasilar infiltrates versus atelectasis.   ASSESSMENT AND PLAN: Chad Luna is an 81 year old male who comes to the Emergency Department with bright red blood per rectum.  1.  Bright red blood per rectum: Most likely either diverticular or hemorrhoidal, as the patient is on Lovenox currently. The patient has moderate amount of blood on the diaper which was brought from the nursing home. The patient's hemoglobin is currently stable. Will continue to follow up with CBC. If the hemoglobin is stable, the patient could be discharged home. We will hold the Lovenox for now. The patient may not be a candidate for any anticoagulation considering the patient's recurrent gastrointestinal bleed. However, consider further discussing with hematology.  2.   Hypertension, moderately controlled: Continue the home medications.  3.  Possible pneumonia: Will continue the levofloxacin.  4.  Altered mental status: The patient does not seem to have any confusion at this time.  5.  Postprandial vomiting: The patient states vomits immediately after eating. This seems to be more esophageal or gastric in nature rather than small intestinal.  6.  Recent history of pulmonary embolism: The patient is status post [place] filter placement; however, the patient may not be a candidate for Lovenox.  7.  Keep the patient on deep vein thrombosis prophylaxis with sequential compression devices.   TIME SPENT: 50 minutes.    ____________________________ Monica Becton, MD pv:jcm D: 10/01/2013 00:28:18 ET T: 10/01/2013 01:39:03 ET JOB#: [id number]  cc: Monica Becton, MD, <Dictator> Adin Hector, MD Monica Becton MD ELECTRONICALLY SIGNED 10/12/2013 0:49

## 2014-08-28 NOTE — Consult Note (Signed)
History of Present Illness:  Reason for Consult recurrent esophageal cancer, PE, rectal bleeding.   HPI   Patient last received chemotherapy and seen in clinic on August 04, 2013 received single agent vinorelbine. Since then he has had multiple hospital admissions for PE, worsening shortness of breath, now likely lower GI bleed. He continues to have difficulty swallowing and states he nearly "throws up" everything eats. He complains of more weakness and fatigue today. He has no neurologic complaints. He denies any recent fevers. He denies any chest pain.  He denies any nausea, constipation, or diarrhea.  He denies any pain.  He has no urinary complaints.  Patient offers no further specific complaints today.  PFSH:  Additional Past Medical and Surgical History CAD status post MI, COPD, OSA, A-fib, arthritis, hypertension, distant history of bladder cancer, left forearm fracture, hernia repair, AAA repair, left hip repair.  Social History: Patient smoked approximately one to one and a half packs tobacco for 30 years. Quit smoking in 1999/06/06.  Family history: Wife had metastatic breast cancer, died in 06/05/2004. Daughter with history of lymphoma, brothers with prostate cancer.   Review of Systems:  Performance Status (ECOG) 1   Review of Systems   As per HPI. Otherwise, 10 point system review was negative.   NURSING NOTES: **Vital Signs.:   59-DJT-70 17:79   Systolic BP Systolic BP: 390    30-SPQ-33 07:50   Vital Signs Type: Routine   Temperature Temperature (F): 97.8   Celsius: 36.5   Temperature Source: oral   Pulse Pulse: 108   Respirations Respirations: 18   Systolic BP Systolic BP: 007   Diastolic BP (mmHg) Diastolic BP (mmHg): 75   Mean BP: 89   Pulse Ox % Pulse Ox %: 97   Pulse Ox Activity Level: At rest   Oxygen Delivery: 3L   Physical Exam:  Physical Exam General: Well-developed, well-nourished, no acute distress. Eyes: Pink conjunctiva, anicteric sclera. HEENT:  Normocephalic, moist mucous membranes, clear oropharnyx. Lungs: Clear to auscultation bilaterally. Heart: Regular rate and rhythm. No rubs, murmurs, or gallops. Abdomen: Soft, nontender, nondistended. No organomegaly noted, normoactive bowel sounds. Musculoskeletal: No edema, cyanosis, or clubbing. Neuro: Alert, answering all questions appropriately. Cranial nerves grossly intact. Skin: No rashes or petechiae noted. Psych: Normal affect.    Erbitux: Itching, Hives    hydrocortisone: 1 application rectal 2 times a day, As needed, hemorrhoids, Status: Active, Quantity: 0, Refills: None   metoclopramide 10 mg oral tablet: 1 tab(s) orally 3 times a day (before meals), Status: Active, Quantity: 0, Refills: None   warfarin 2 mg oral tablet: 1 tab(s) orally once a day, Status: Active, Quantity: 30, Refills: None   levofloxacin 500 mg oral tablet: 1 tab(s) orally once a day x 10 days, Status: Active, Quantity: 10, Refills: None   Vitamin B-12 500 mcg oral tablet: 1 tab(s) orally once a day for supplement, Status: Active, Quantity: 0, Refills: None   allopurinol 100 mg oral tablet: 1 tab(s) orally once a day for gout, Status: Active, Quantity: 0, Refills: None   Vitamin D3 2000 intl units oral tablet: 1 tab(s) orally once a day for supplement, Status: Active, Quantity: 0, Refills: None   Symbicort 160 mcg-4.5 mcg/inh inhalation aerosol: 2 puff(s) inhaled 2 times a day for COPD, Status: Active, Quantity: 1, Refills: 11   Protonix 40 mg oral delayed release tablet: 1 tab(s) orally 2 times a day for GERD, Status: Active, Quantity: 0, Refills: None   Lasix 40 mg oral tablet:  1 tab(s) orally every 24 hours, As Needed for edema, Status: Active, Quantity: 0, Refills: None   potassium chloride 10 mEq oral tablet, extended release: 1 tab(s) orally every 24 hours, As Needed for edema when taking Lasix, Status: Active, Quantity: 0, Refills: None   Zofran 4 mg oral tablet: 1 tab(s) orally every 8 hours,  As Needed - for Nausea, Vomiting, Status: Active, Quantity: 0, Refills: None   Combivent Respimat CFC free 100 mcg-20 mcg/inh inhalation aerosol: 1 puff(s) inhaled every 6 hours, As Needed - for Shortness of Breath/respiratory distress, Status: Active, Quantity: 0, Refills: None   docusate sodium 100 mg oral capsule: 1 cap(s) orally once a day (at bedtime) for constipation prevention, Status: Active, Quantity: 0, Refills: None   Flomax 0.4 mg oral capsule: 1 cap(s) orally once a day (at bedtime) for BPH, Status: Active, Quantity: 0, Refills: None  Laboratory Results: Routine Chem:  19-May-15 04:02   Glucose, Serum 98  BUN  21  Creatinine (comp)  1.36  Sodium, Serum 142  Potassium, Serum  3.2  Chloride, Serum 103  CO2, Serum  36  Calcium (Total), Serum 8.6  Anion Gap  3  Osmolality (calc) 286  eGFR (African American)  55  eGFR (Non-African American)  47 (eGFR values <49m/min/1.73 m2 may be an indication of chronic kidney disease (CKD). Calculated eGFR is useful in patients with stable renal function. The eGFR calculation will not be reliable in acutely ill patients when serum creatinine is changing rapidly. It is not useful in  patients on dialysis. The eGFR calculation may not be applicable to patients at the low and high extremes of body sizes, pregnant women, and vegetarians.)  Magnesium, Serum  1.3 (1.8-2.4 THERAPEUTIC RANGE: 4-7 mg/dL TOXIC: > 10 mg/dL  -----------------------)  Routine Hem:  19-May-15 04:02   WBC (CBC) 6.6  RBC (CBC)  3.46  Hemoglobin (CBC)  10.1  Hematocrit (CBC)  31.0  Platelet Count (CBC)  131  MCV 90  MCH 29.2  MCHC 32.6  RDW  22.0  Neutrophil % 67.1  Lymphocyte % 13.9  Monocyte % 10.7  Eosinophil % 6.6  Basophil % 1.7  Neutrophil # 4.4  Lymphocyte #  0.9  Monocyte # 0.7  Eosinophil # 0.4  Basophil # 0.1 (Result(s) reported on 22 Sep 2013 at 05:54AM.)   Assessment and Plan: Impression:   recurrent esophageal cancer, PE, rectal  bleeding. Plan:   1. Recurrent esophageal cancer:  Patient last received treatment with single agent vinorelbine on August 04, 2013. Chemotherapy is currently on hold given his recent multiple hospital admissions. Patient has been [place] for further evaluation. Have also consulted palliative care for assistance. PE: Patient will likely continue to require anticoagulation. A filter has is being place today to minimize his pulmonary clotburden given his worsening shortness of breath recently.Lower GI bleed: Likely secondary to supratherapeutic INR. Patient will continue to require anticoagulation as above.Anemia: Patient's hemoglobin is approximately his baseline, monitor.Disposition: Discharge in several days [place] consult, will follow.   Electronic Signatures: FDelight Hoh(MD)  (Signed 19-May-15 13:08)  Authored: HISTORY OF PRESENT ILLNESS, PFSH, ROS, NURSING NOTES, PE, ALLERGIES, HOME MEDICATIONS, LABS, ASSESSMENT AND PLAN   Last Updated: 19-May-15 13:08 by FDelight Hoh(MD)

## 2014-08-28 NOTE — Consult Note (Signed)
I was asked by Dr. Caryl Comes to do EGD because of patient vomiting and will do this tomorrow.  I had to wait a day due to  Lovenox.  Electronic Signatures: Manya Silvas (MD)  (Signed on 21-May-15 17:30)  Authored  Last Updated: 21-May-15 17:30 by Manya Silvas (MD)

## 2014-08-28 NOTE — Consult Note (Signed)
   Comments   I spoke with pt's daughter, Haynes Kerns. She agrees that a family meeting would be useful. She will contact sisters and arrange a time for a meeting tomorrow.   Electronic Signatures: Consuelo Suthers, Izora Gala (MD)  (Signed 28-May-15 21:20)  Authored: Palliative Care   Last Updated: 28-May-15 21:20 by Abie Cheek, Izora Gala (MD)

## 2014-08-28 NOTE — Consult Note (Signed)
PATIENT NAME:  Chad Luna, PUCCINELLI MR#:  [id number] DATE OF BIRTH:  06-03-1926  DATE OF CONSULTATION:  08/16/2013  REFERRING PHYSICIAN:  Hospitalist CONSULTING PHYSICIAN:  Deniz Hannan R. Ma Hillock, MD  REASON FOR CONSULTATION: Pancreatic cancer, admitted with pulmonary embolism.   HISTORY OF PRESENT ILLNESS: The patient is an 81 year old gentleman with known history of esophageal cancer, more recently on chemotherapy with single agent Navelbine last dose received on 03/31. He is under the care of Dr. Grayland Ormond. The patient was admitted to the hospital yesterday with complaints of falling and unable to get up, He also has intermittent nausea, vomiting and sputum production. Oral intake has been declining lately and he does not do much activity. He had CT scan of the chest done in the Emergency Room which showed acute pulmonary embolism. He has been started on IV heparin. He is currently on 3 liters nasal cannula oxygen and saturating 91% to 92%. He denies any shortness of breath at rest, chest pain, hemoptysis. No major pain issues at this time.   PAST MEDICAL HISTORY AND PAST SURGICAL HISTORY: 1. Esophageal cancer getting chemotherapy.  2. Hypertension.  3. Hemorrhoids.  4. BPH. 5. Hernia.  6. Stent in the aorta for aneurysm repair.  7. Tonsillectomy.  8. Hip fracture surgery.  9. Arm surgery.   ALLERGIES: No known drug allergies.   HOME MEDICATIONS: Allopurinol 100 mg daily, Aspirin 81 mg daily, Combivent inhaler, diltiazem ER 240 mg daily, Colace 1 tablet each bedtime, Lasix 40 mg daily when necessary, potassium chloride 10 mEq daily when necessary, lisinopril 5 mg daily, Reglan 10 mg 4 times a day, pantoprazole 40 mg twice a day, Symbicort inhaler, Tamsulosin 0.4 mg daily, Vitamin B to 500 mcg daily, Vitamin D3 2000 units daily.   REVIEW OF SYSTEMS:   CONSTITUTIONAL: Weakness, decreased appetite, decreased level of activity. No fevers or chills but did have temperature of 101.9 on admission.    HEENT: Denies any headaches or dizziness at rest. No epistaxis, ear or jaw pain. Mild sinus symptoms.  CARDIAC: Denies any angina, palpitation, orthopnea, or paroxysmal nocturnal dyspnea.  LUNGS: Has dyspnea on exertion. Otherwise as in history of present illness. No wheezing.  GASTROINTESTINAL: Has intermittent nausea and vomiting. No diarrhea or bright red blood in stools or melena.  GENITOURINARY: No dysuria or hematuria.  EXTREMITIES: Denies new swelling or pain.  MUSCULOSKELETAL: Chronic arthritis. No new bone pains.  NEUROLOGIC: No new focal weakness, seizures or loss of consciousness. Has generalized weakness and had fall prior to admission.  ENDOCRINE: No polyuria or polydipsia. Appetite is poor.   PHYSICAL EXAMINATION: GENERAL: The patient is elderly, weak and frail-looking, sitting in bed and alert and oriented and converses appropriately. No acute distress. On nasal cannula oxygen.  VITAL SIGNS: Temperature 101.9, 126, 123/70, 91% on 3 liters oxygen.  HEENT: Normocephalic, atraumatic. Extraocular movements intact. Sclerae anicteric.  NECK: Negative for lymphadenopathy.  CARDIOVASCULAR: S1, S2, regular rate and rhythm, tachycardic.  LUNGS: Show bilateral diminished breath sounds at bases, no rhonchi.  ABDOMEN: Soft, nontender. Bowel sounds present.  EXTREMITIES: Trace edema. No cyanosis.  SKIN: No major bruising or generalized rashes.  NEUROLOGIC: Limited examination. Cranial nerves seem intact. Moves all extremities spontaneously.   LABORATORY, DIAGNOSTIC, AND RADIOLOGICAL DATA: WBC 1200, ANC 700, hemoglobin 8.9, platelets 181.   CT scan of the chest reports left upper lobe acute pulmonary embolism.   IMPRESSION AND RECOMMENDATIONS: This is an 81 year old gentleman with known history of esophageal cancer on single agent Navelbine chemotherapy (last dose  given 03/31) by Dr. Grayland Ormond, currently admitted with progressive weakness, recent fall, failure to thrive issues and also  found to have left upper lobe acute pulmonary embolism. Bilateral lower extremity venous Doppler is negative for evidence of deep vein thrombosis. CBC shows adequate platelet count, anemia which is likely chronic and also secondary to ongoing chemotherapy, continue to monitor this. Agree with ongoing anticoagulation plan for pulmonary embolism. The patient explained that he is at continued high risk for thromboembolic phenomena given history of malignancy and decreased physical activity. In addition, CBC shows mild leukopenia along with neutropenia with ANC down to 700. He was admitted with fever up to 101.9, which could be infectious etiology versus secondary to pulmonary embolism, but given neutropenia we will start on empiric antibiotics ciprofloxacin 500 mg p.o. q.12h. We will place him on neutropenic precautions and give Neupogen 300 mcg subcutaneous (given his frail condition, fever and neutropenia). Neupogen will need to be continued as based upon CBC results. Continue to monitor CBC daily. He does not have major pain issues at this time. The patient is DO NOT RESUSCITATE. Oncology will continue to follow. The patient and family present explained above details, they are agreeable to this plan   Thank you for the referral. Please feel free to contact me if any additional questions.  ____________________________ Rhett Bannister Ma Hillock, MD srp:sg D: 08/16/2013 13:10:11 ET T: 08/16/2013 14:10:18 ET JOB#: [id number]  cc: Vasily Fedewa R. Ma Hillock, MD, <Dictator> Alveta Heimlich MD ELECTRONICALLY SIGNED 08/17/2013 8:46

## 2014-08-29 NOTE — Consult Note (Signed)
Brief Consult Note: Diagnosis: Left intertrochanteric hip fracture.   Patient was seen by consultant.   Recommend to proceed with surgery or procedure.   Orders entered.   Discussed with Attending MD.   Comments: Patient is an 81 y/o male who fell while at the [facility] cancer center today.  Another person in the office tried to assist him by opening the door for him, but this caused him to lose his balance and fall.  He hit his head and has a superficial abrasion and he also injured his left wrist and hip.  Patient denies LOC.  He is an independent community ambulator at baseline.  He has esophageal cancer.  On exam today, his left wrist has mild swelling and is in a removable splint.  He can flex and extend his fingers and has intact sensation to light touch in all digits.  His fingers are well perfused.  Examination of the left hip reveals the skin is intact.  There is no erythema, ecchymosis or swelling.  His left lower extremity is shortened and externally rotated.  He has intact pedal pulses and motor function and intact sensation to light touch.  Radiographs were reviewed by me and demonstrate a displaced intertrochanteric hip fracture.  The wrist films show a comminuted fracture extending into the articular surface of the radius.  There is radial shortening but neutral alignment on the lateral view.  I am recommending ORIF of the left hip and cast treatment for the left distal radius.  Surgery is scheduled for tomorrow AM.  Patient has been admitted to medicine and he has been cleared for surgery by Dr. Tressia Miners.  He will be NPO after midnight.  Surgical site signed as per "right site surgery" protocol. The risks and benefits of surgical intervention were discussed in detail with the patient. The patient expressed understanding of the risks and benefits and agreed with plans for surgery. The risks include, but are not limited to: infection, bleeding requiring transfusion, nerve and blood vessel  injury, leg length discrepancy, change in LE rotation, malunion, nonunion, painful hardware, failure of the hardware, screw cut out, persistent hip pain, need for more surgery, DVT, and PE, MI, stroke, pneumonia, respiratory failure and death.  Electronic Signatures: Thornton Park (MD)  (Signed 16-May-13 19:25)  Authored: Brief Consult Note   Last Updated: 16-May-13 19:25 by Thornton Park (MD)

## 2014-08-29 NOTE — Op Note (Signed)
PATIENT NAME:  Chad Luna, Chad Luna MR#:  [id number] DATE OF BIRTH:  [birth year]  DATE OF PROCEDURE:  09/21/2011  PREOPERATIVE DIAGNOSIS: Left intertrochanteric hip fracture and left comminuted distal radius fracture.   POSTOPERATIVE DIAGNOSIS: Left intertrochanteric hip fracture and left comminuted distal radius fracture.      PROCEDURE: Open reduction and internal fixation of left intertrochanteric hip fracture with Synthes DHS compression screw and closed reduction and casting of left distal radius fracture.   SURGEON:  Thornton Park, MD.  ANESTHESIA: Spinal.   ESTIMATED BLOOD LOSS: 100 mL.  COMPLICATIONS: None.   IMPLANTS: Synthes DHS two-hole short barrel sideplate with 371 mm lag screw length with compression screw.   INDICATIONS FOR PROCEDURE: The patient is an 81 year old male with esophageal cancer. He fell at the [facility] here at [facility] yesterday sustaining the above-noted injuries. 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 The left wrist fracture is reasonably aligned on the AP and lateral x-rays and I am recommending closed treatment for this injury. I reviewed the risks and benefits of the procedure with the patient and his daughter the night prior to surgery. They understand the risks include infection, bleeding requiring blood transfusion, nerve or blood vessel injury, leg length discrepancy, change in leg rotation, malunion, nonunion, painful hardware, failure of the hardware, persistent hip pain and the need for further surgery, cut out of the lag screw is possible.   Medical complications include deep vein thrombosis and pulmonary embolism, myocardial infarction, stroke, pneumonia, respiratory failure, and death. The patient signed the consent form. I signed the patient's left hip with the word "yes" according the  hospital's right site protocol.   DESCRIPTION OF PROCEDURE: The patient was brought to the Operating Room where he underwent a spinal anesthetic. He was then positioned supine on the fracture table. His left leg was placed in traction. His right leg was placed up in a hemi-lithotomy position. All bony prominences were adequately padded. The patient was then prepped and draped in a sterile fashion. A time-out was performed to verify the patient's name, date of birth, medical record number, correct site of surgery, and correct procedure to be performed. It was also used to verify the patient had received antibiotics and that all appropriate instruments, implants, and radiographic studies were available in the room. Once all in attendance were in agreement, the case began. The patient received 2 grams of Kefzol prior to the onset of the case.   C-arm images in both the AP and lateral planes were performed and allowed for reduction of the fracture. Once adequate reduction had been achieved, the C-arm was used to plan the surgical incision. A #10 blade was used to incise this skin laterally over the greater trochanter in line with the femur. The subcutaneous tissues were cleared off the fascia lata. A deep #10 blade was then used to incise the fascia lata revealing the underlying vastus lateralis muscle. This was carefully and bluntly dissected in line with its muscle fibers to reveal the lateral cortex of the femur. A 130 degree drill guide was then used to pass a guidewire from the lateral cortex across the fracture site and into the femoral head. The position of the guidepin was confirmed on the AP and lateral projections. The tip apex distance was less than 25 millimeters. This guidewire was then overdrilled with 110 mm drill. A  110 mm lag screw along with a two-hole Synthes sideplate was assembled on the back table. The lag screw was then manually advanced into position. The two- hole sideplate was then slid  down along the lateral cortex of the femur. The position of the lag screw was confirmed on AP and lateral projections. The sideplate was then affixed to the femur with two 4.5 mm bicortical screws. A compression screw was then placed into the lag screw and after traction was released to allow for compression at the fracture site. Final AP and lateral C-arm images were performed. The wound was then copiously irrigated. The fascia lata was closed with interrupted 0 Vicryl sutures and copiously irrigated again. The subcutaneous tissues were closed with 2-0 Vicryl. The skin approximated with skin staples. Dry sterile dressing was applied.   The attention was then turned to the patient's left wrist. The C-arm was used to take AP and lateral projections. A manual reduction was performed and a short arm cast applied. Ulnar deviation of the wrist in a 3-point mold allowed for good alignment of the distal radius fracture in both the AP and lateral planes. Final C-arm images were performed. The patient was then transferred to a hospital bed and brought to the PAC-U in stable condition. I was scrubbed and present for the entire case and all sharp and instrument counts were correct at the conclusion of   the case. I spoke with the patient's daughter, who works at our [place] desk to let her know the case had gone without complication and that her father was stable in the recovery room.   ____________________________ Timoteo Gaul, MD klk:ap D: 09/21/2011 12:44:11 ET T: 09/21/2011 13:37:55 ET JOB#: [id number]  cc: Timoteo Gaul, MD, <Dictator> Timoteo Gaul MD ELECTRONICALLY SIGNED 09/25/2011 10:46

## 2014-08-29 NOTE — Discharge Summary (Signed)
PATIENT NAME:  Chad Luna, Chad Luna MR#:  [id number] DATE OF BIRTH:  1926/11/10  DATE OF ADMISSION:  09/20/2011 DATE OF DISCHARGE:  09/24/2011  FINAL DIAGNOSES:  1. Left femur fracture.  2. Left radius fracture.  3. Esophageal adenocarcinoma, recurrent, undergoing active chemotherapy.  4. Chronic obstructive pulmonary disease.  5. Coronary artery disease.  6. Obstructive sleep apnea.  7. Atrial fibrillation.  8. Hypertension.  9. History of bladder cancer.  10. History of ventral hernia repair.  11. History of bilateral carpal tunnel release.  12. Peripheral vascular disease with history of abdominal aortic aneurysm repair.  13. History of left knee arthroscopy.  14. History of plantar neuroma resection.  15. Bilateral cataracts.  16. History of Port-A-Cath placement.  17. History of repair of perforated peptic ulcer.   HISTORY AND PHYSICAL: Please see dictated admission History and Physical.   [place]: The patient was admitted after falling in the bathroom while he was going to get chemotherapy. He was found to have evidence of left femur and left radius fractures. The radius was casted. He underwent open reduction and internal fixation of the left femur fracture through Dr. Thornton Park. He made good progress with this, was up with physical therapy with a platform rolling walker, and was tolerating this reasonably well. He did have some itching with morphine, and was placed back on Percocet at his home dose. He did have some trouble with constipation, though, and his bowel regimen was adjusted. He had no significant chest pain, shortness of breath, or palpitations. He will be discharged to a skilled nursing facility in stable condition with his physical activity to be up with a platform rolling walker with assistance, with weight-bearing on the left leg as tolerated. He will be on fall and bleeding precautions. He will need dressing changes daily as needed. He should be on  auto CPAP 5 to 20 cm of water whenever he is sleeping. Diet will be mechanical soft, no added salt. If he is tolerating this well, his mechanical soft can be advanced. Physical therapy and occupational therapy should evaluate and treat the patient. He will need a MET-B and CBC in one week with results to the nursing home physician. We will anticipate him following up with Dr. Mack Guise in the next one week, and following with Dr. Grayland Ormond in 7 to 10 days.   DISCHARGE MEDICATIONS:  1. Lovastatin 40 mg p.o. at bedtime.  2. Celebrex 200 mg p.o. daily.  3. Surfak 240 mg p.o. at bedtime.  4. MiraLAX 17 grams p.o. daily p.r.n. constipation.  5. Combivent Respimat 1 inhalation 4 times a day.  6. Cardizem CD 120 mg p.o. daily.  7. Nexium 40 mg p.o. b.i.d. before meals.  8. Advair 250/50, 1 puff b.i.d.  9. Levothyroxine 0.05 mg p.o. daily. 10. Lotensin 20 mg p.o. daily.  11. Toprol-XL 50 mg p.o. daily.  12. Lovenox 30 mg subcutaneous b.i.d. times 7 days.  13. Enteric-coated aspirin 81 mg p.o. daily.  14. Ferrous sulfate 325 mg p.o. daily with food for anemia.  15. Percocet 2.5/325 mg, 1 p.o. q. 6 hours p.r.n. severe pain.   CODE STATUS: The patient was FULL CODE during his hospitalization.   TIME OF DISCHARGE: 45 minutes.    ____________________________ Adin Hector, MD bjk:bjt D: 09/24/2011 94:17:40 ET T: 09/24/2011 12:57:40 ET JOB#: [id number]  cc: Adin Hector, MD, <Dictator> Kathlene November. Grayland Ormond, MD Timoteo Gaul, MD Ramonita Lab MD ELECTRONICALLY SIGNED 10/03/2011 7:43

## 2014-08-29 NOTE — H&P (Signed)
PATIENT NAME:  Chad Luna, DUTCH MR#:  [id number] DATE OF BIRTH:  28-Aug-1926  DATE OF ADMISSION:  09/20/2011  ADMITTING PHYSICIAN: Gladstone Lighter, MD   PRIMARY CARE PHYSICIAN: Tama High, III, MD   CHIEF COMPLAINT: Fall and left hip pain.   HISTORY OF PRESENT ILLNESS: Mr. Chad Luna is an 81 years old gentleman with past medical history significant for recurrent esophageal carcinoma, being started on Taxol chemotherapy today, history of coronary artery disease status post stents, chronic obstructive pulmonary disease, and obstructive sleep apnea on CPAP at home, not on any home oxygen otherwise, hypertension, who was sent in from the [facility] after he had a fall over there. The patient has been following up with Dr. Grayland Ormond for his recurrent esophageal cancer. He had a recent endoscopy done by Dr. Vira Agar which showed possible recurrence of the cancer based on the biopsy, and he was supposed to be started on cycle one, day one of Taxol today. The patient was almost finished with his chemotherapy infusion, and he walked to the bathroom and was trying to turn the light on, and had somebody hold the door, but he lost balance and fell onto the floor. Since then he has been complaining of left hip and also left wrist pain, and he was brought to the ED and x-ray is showing left hip intertrochanteric comminuted fracture. The patient has a history of coronary artery disease but has not had any chest pains recently. He lives at home by himself. He is pretty functional, walks with a cane. He does complain of chronic cough and dyspnea. Part of it is secondary to his esophageal cancer, but he still does laundry by himself and has not had any worsening shortness of breath otherwise. He denies any worsening pedal edema or other cardiac complaints. He is being admitted on the Medical Service because of his multiple medical problems and other consultation requested, and he will possibly have hip surgery tomorrow.    PAST MEDICAL HISTORY:  1. Recurrent esophageal carcinoma, being started on Taxol chemotherapy starting today.  2. Coronary artery disease, status post stents in the past.  3. Chronic obstructive pulmonary disease, not on any home oxygen.  4. Obstructive sleep apnea, on CPAP.  5. Atrial fibrillation.  6. Hypertension.  7. Distant history of bladder cancer.  PAST SURGICAL HISTORY:  1. Ventral hernia repair.  2. Bilateral carpal tunnel release.  3. Abdominal aortic aneurysm repair.  4. Left knee arthroscopy.  5. Plantar neuroma resection.  6. Cataract surgery.  7. Port-A-Cath placement.  8. Repair of perforated peptic ulcer.   ALLERGIES TO MEDICATIONS: Intolerant to Erbitux, which is a chemotherapy medication.   CURRENT HOME MEDICATIONS:  1. Percocet 2.5/325 mg, 1 tablet every 6 hours p.r.n. for pain.  2. Lovastatin 40 mg at bedtime.  3. Sublingual nitroglycerin 0.4 mg p.r.n. for chest pain.  4. Combivent inhaler 2 puffs every 6 hours p.r.n.  5. Vitamin D 1 tablet p.o. daily, unknown dose.  6. Advair 250/50, 1 puff b.i.d.   7. Cardizem 120 mg p.o. daily.  8. Toprol 50 mg p.o. daily. 9. Aspirin 81 mg p.o. daily.  10. Synthroid 50 mcg p.o. daily.  11. Nexium 40 mg b.i.d.  12. Allopurinol 100  mg p.o. daily.   13. Benazepril 20 mg p.o. daily. 14. Lasix 40 mg p.o. daily.  15. Potassium chloride 10 mEq p.o. daily.   SOCIAL HISTORY: He lives at home by himself. History of smoking but quit in 2001. No history of  any alcohol use.   FAMILY HISTORY: Both parents lived up to 68s and had arthritis, but the patient is not aware of any particular heart disease or cancer in the family.   REVIEW OF SYSTEMS:  CONSTITUTIONAL: No fever, fatigue, or pain. EYES: Positive for cataract surgery. No glaucoma or inflammation. ENT: Positive for mild hearing loss. No tinnitus, ear pain, epistaxis or discharge. Positive for dysphagia. RESPIRATORY: Positive for chronic obstructive pulmonary disease and  obstructive sleep apnea. Has chronic cough. No wheeze or dyspnea currently. CARDIOVASCULAR: No chest pain, orthopnea, edema, arrhythmia, palpitations, or syncope. GI: No nausea or vomiting. Positive for reflux symptoms. No diarrhea, abdominal pain, or hematemesis. GENITOURINARY: No dysuria, hematuria, renal calculus, frequency, or incontinence. ENDOCRINE: No polyuria, nocturia, thyroid problems, heat or cold intolerance. HEMATOLOGY: No anemia, easy bruising or bleeding. SKIN: No acne, rash, or lesions. MUSCULOSKELETAL: Positive for left hip pain. No neck pain, shoulder pain or back pain. NEUROLOGICAL: No cerebrovascular accident, transient ischemic attack, or seizures. PSYCHOLOGICAL: No anxiety, insomnia, or depression.   PHYSICAL EXAMINATION:  VITAL SIGNS: Temperature 97 degrees Fahrenheit, pulse is 78, respirations 18, blood pressure 151/81, pulse oximetry 94% on room air.   GENERAL: Well built, well nourished male lying in bed, not in any acute distress.   HEENT: Normocephalic, atraumatic. Pupils are equal, round, reacting to light. Anicteric sclerae. Extraocular movements intact. Oropharynx clear without erythema, mass, or exudates.   NECK: Supple. No thyromegaly, jugular venous distention or carotid bruits. No lymphadenopathy.   LUNGS: Moving air bilaterally. Decreased bibasilar breath sounds. No wheeze or crackles. Otherwise, no use of accessory muscles for breathing.   CARDIOVASCULAR: S1, S2 regular rate and rhythm. No murmurs, rubs, or gallops.   ABDOMEN: Soft, nontender, nondistended. No hepatosplenomegaly. Normal bowel scans. Has ventral hernia without any obstruction or incarceration.  EXTREMITIES: The left leg is guarded secondary to pain, is abducted and externally rotated. No pedal edema. No clubbing or cyanosis. Dorsalis pedis pulse 2+ palpable bilaterally.   SKIN: No acne, rash, or lesions.   NEUROLOGICAL: Cranial nerves are intact. No focal motor or sensory deficits.    PSYCHOLOGICAL: The patient is awake, alert, oriented x3.   LABORATORY, DIAGNOSTIC AND RADIOLOGICAL DATA:  WBC 8.1, hemoglobin 12.1, hematocrit 37.1, platelet count 184.  Sodium 141, potassium 4.2, chloride 108, bicarbonate 28, BUN 13, creatinine 1.3, glucose 155, and calcium of 8.4.  ALT 17, AST 25, alkaline phosphatase 94, total bilirubin 0.5, albumin 3.0.  INR 0.9.  Urinalysis negative for any infection.  CT of the head without contrast for the fall showed no acute intracranial process, generalized cerebral atrophy present.  CT of the C-spine showing no acute osseous injury of the C-spine.  Pelvic x-ray showing comminuted intertrochanteric fracture of the left hip.  Left hip x-ray showing fracture of the left hip as described above.  Chest x-ray showing atelectatic changes at the right base, stable cardiomegaly. No acute bony abnormalities are present.  EKG showing normal sinus rhythm with right bundle branch block changes. No acute ST-T wave changes. Heart rate of 84.  ASSESSMENT AND PLAN: The patient is an 81 year old male with past medical history of recurrent esophageal cancer, went to the [facility] today to start his Taxol cycle one, day one chemotherapy, history of coronary artery disease. He lost his balance, fell, and had left hip fracture.   1. Preoperative evaluation: He is a moderate risk with his history of coronary artery disease; however, he has been pretty functional without any symptoms lately. No chest pain.  His last stent was about two years; but since the benefits might outweigh risks at this time, he is okay to proceed with surgery tomorrow to fix his left hip. From an Oncology standpoint, with resuming the Taxol he is not at risk for any anemia, bone marrow suppression, and is okay from that standpoint, too. He will need adequate blood pressure management and also postoperative respiratory issues probably from his underlying chronic obstructive pulmonary disease that  need to be managed. Continue Toprol.  2. Left wrist pain: He is also complaining of left wrist pain. A splint is placed, and x-ray just done  is pending.  3. Recurrent esophageal cancer with recent EGD showing recurrence of his cancer: He was on prior chemotherapy before; but because of the recent recurrence, he is being started on Taxol. He just received his first dose today, and I have already discussed with Dr. Grayland Ormond. Taxol should not affect his counts and is okay for surgery from that standpoint. He can follow up with Dr. Grayland Ormond after discharge as an outpatient.  4. Coronary artery disease: Appears to be stable at this point. Can restart aspirin after surgery.  5. Hypotension and atrial fibrillation, rate controlled: He is on Cardizem and Toprol and benazepril, not on any systemic anticoagulation.  6. Chronic obstructive pulmonary disease and obstructive sleep apnea, stable: Watch for postoperative hypoxia, atelectasis. Early incentive spirometry and CPAP at nighttime.  7. Hypothyroidism: He is on Synthroid.  8. He will be transferred over to [facility], Dr. Eustace Moore Klein's service.   CODE STATUS: FULL CODE.   TIME SPENT ON ADMISSION: 50 minutes.   ____________________________ Gladstone Lighter, MD [id number] D: 09/20/2011 17:10:08 ET T: 09/20/2011 18:48:48 ET JOB#: [id number]  cc: Gladstone Lighter, MD, <Dictator> Adin Hector, MD Kathlene November. Grayland Ormond, MD Gladstone Lighter MD ELECTRONICALLY SIGNED 09/22/2011 9:08

## 2016-02-28 IMAGING — CT CT ANGIO CHEST
2 of 6 series · 18 of 36 positions shown · IV contrast (APPLIED)
Comparison: DG CHEST 2V dated 08/15/2013; CT CHEST W/ CM dated
11/02/2012

CLINICAL DATA: Fall, tachycardia, elevated D-dimer, esophageal
cancer

EXAM:
CT ANGIOGRAPHY CHEST WITH CONTRAST
TECHNIQUE: Multidetector CT imaging of the chest was performed using the
standard protocol during bolus administration of intravenous
contrast. Multiplanar CT image reconstructions and MIPs were
obtained to evaluate the vascular anatomy.
CONTRAST:  70 mL Isovue 370

[Series 5: pe 1.0 thins · axial · 0.74mm/px · z∈[-284,-46]mm · 17 of 269 slices shown]
[im 15/269  lung]
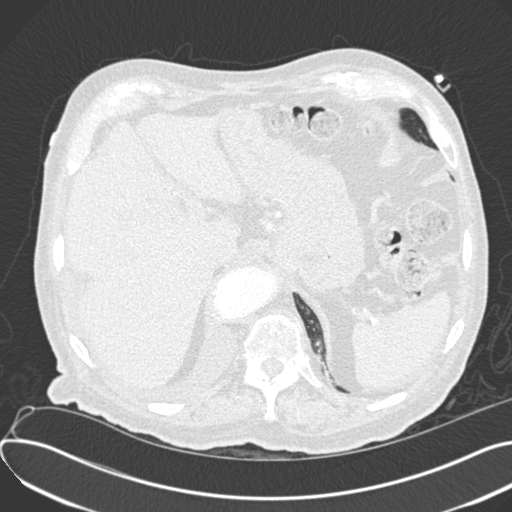
[im 30/269  mediastinal]
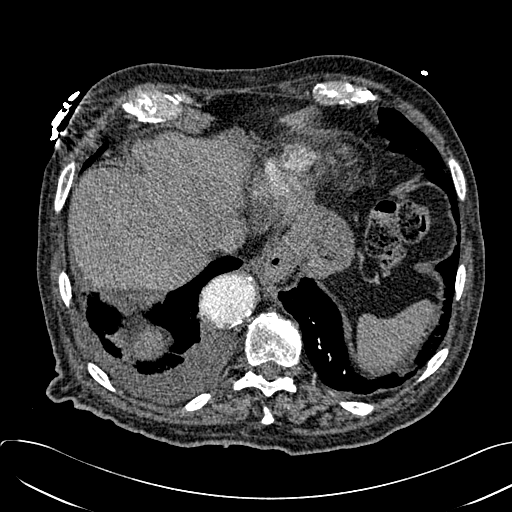
[im 45/269  lung]
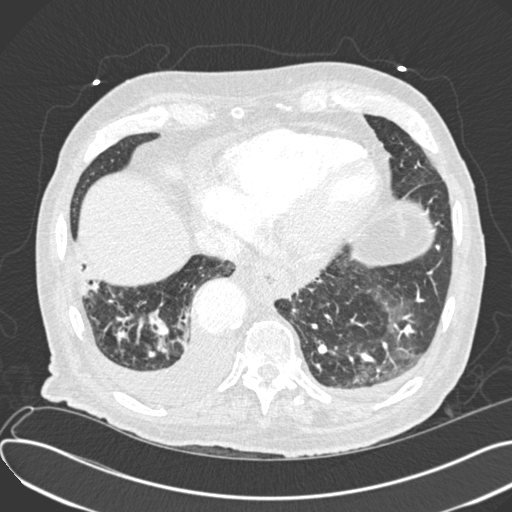
[im 60/269  mediastinal]
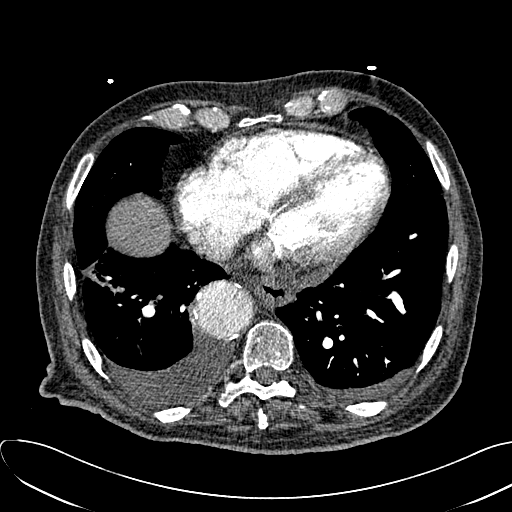
[im 75/269  lung]
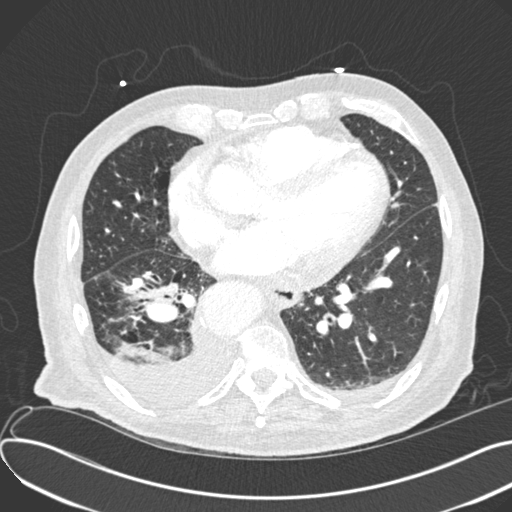
[im 90/269  mediastinal]
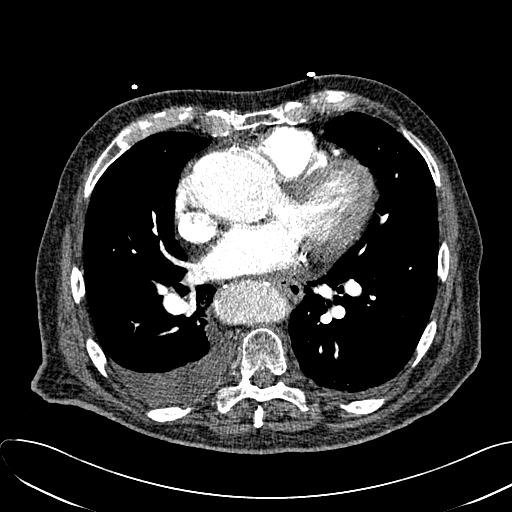
[im 105/269  lung]
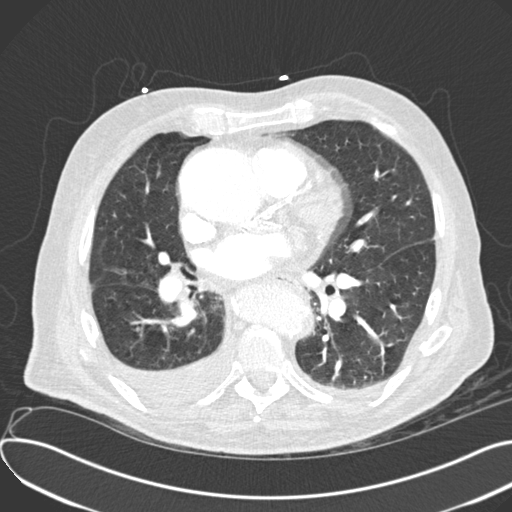
[im 120/269  mediastinal]
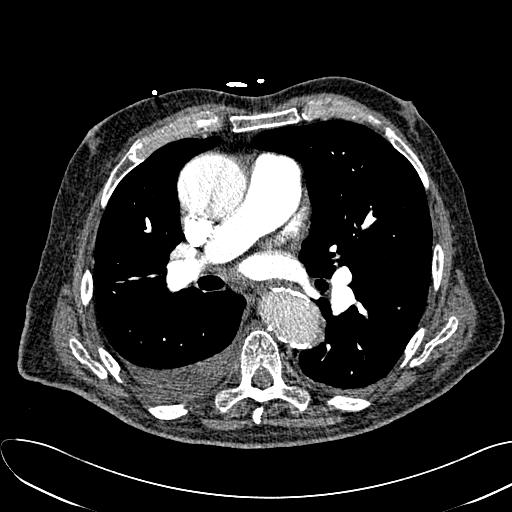
[im 135/269  lung]
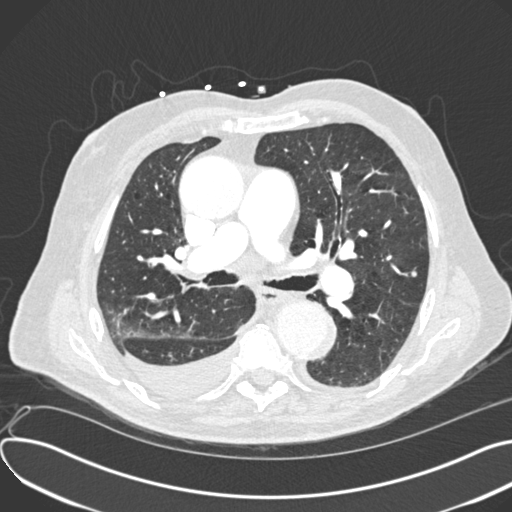
[im 149/269  mediastinal]
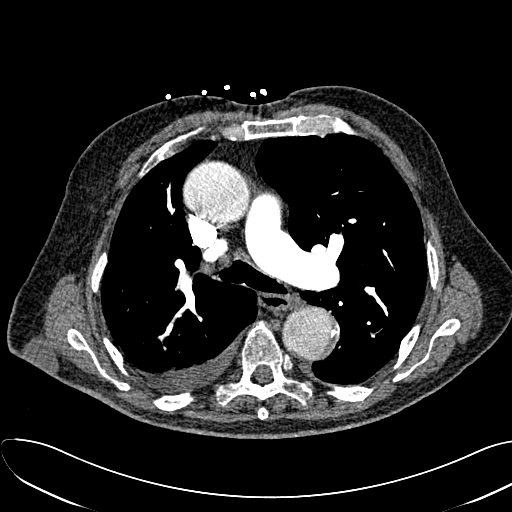
[im 164/269  lung]
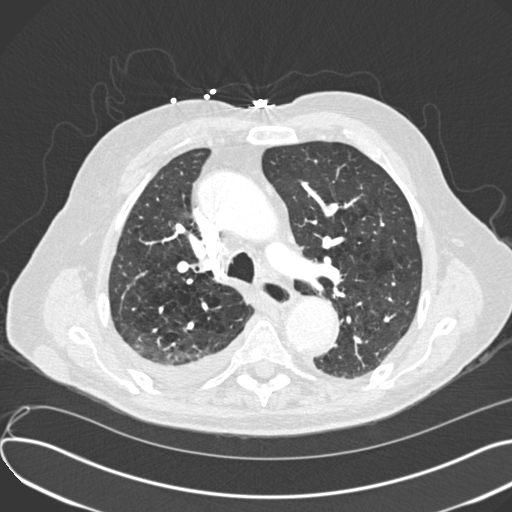
[im 179/269  mediastinal]
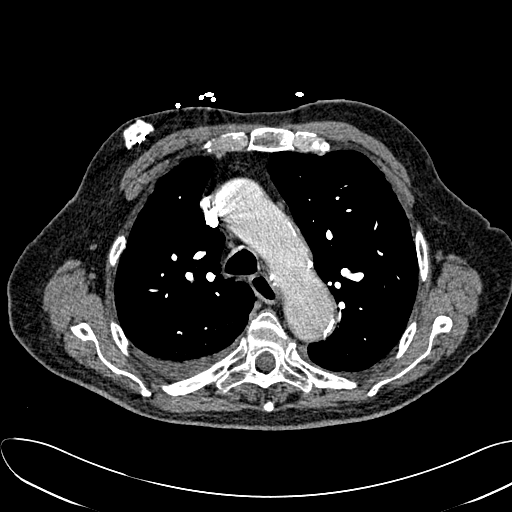
[im 194/269  lung]
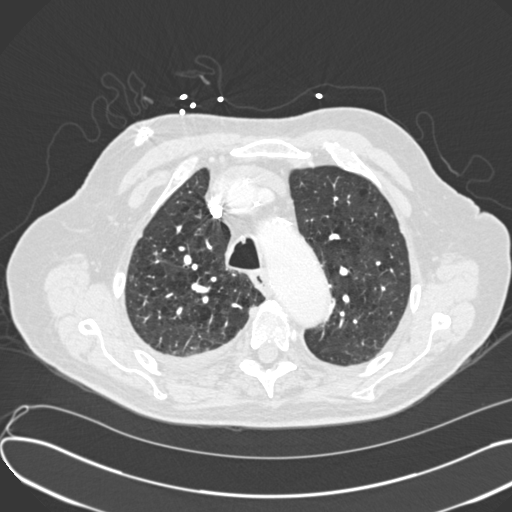
[im 209/269  mediastinal]
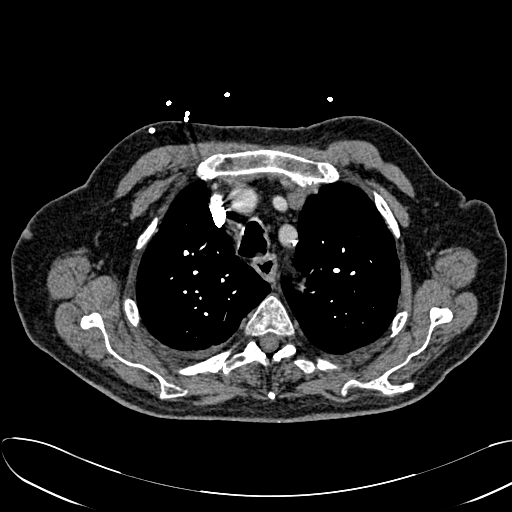
[im 224/269  lung]
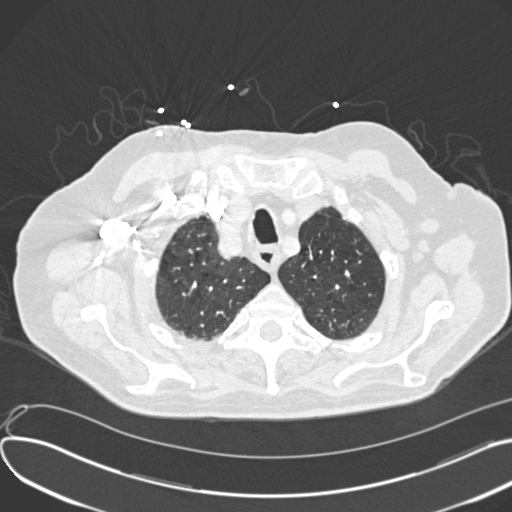
[im 239/269  mediastinal]
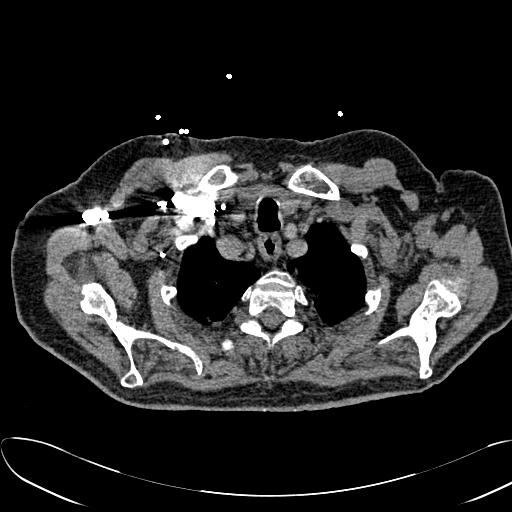
[im 254/269  lung]
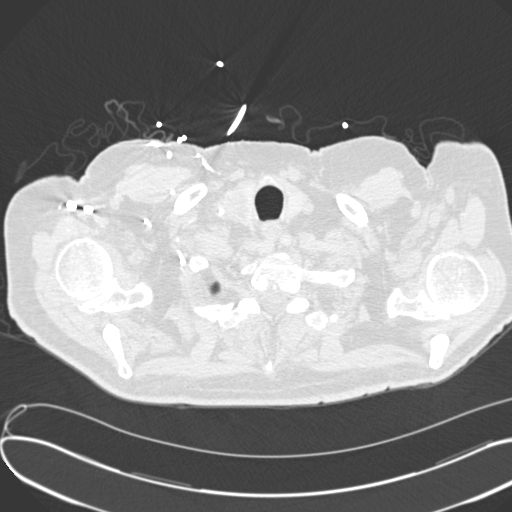

[Series 7: cor pe 2.0 mpr · coronal · 0.52mm/px · 1 of 132 slices shown]
[im 66/132  mediastinal]
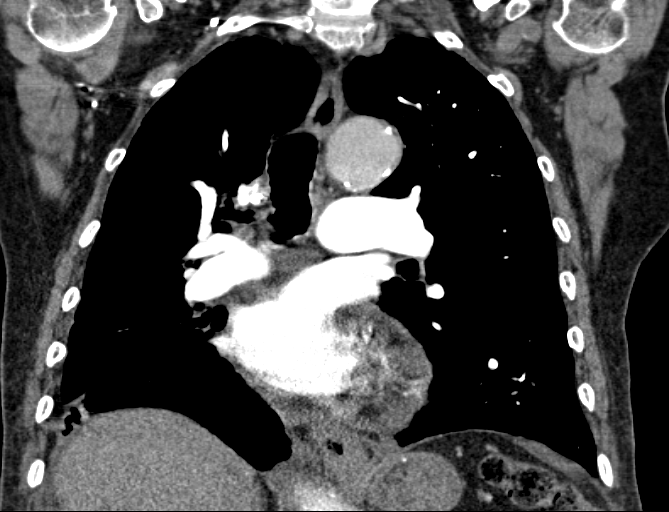

[18 of 36 positions shown; findings below may reference images not displayed]

FINDINGS: Similar to the prior study, there is aortic dilatation, with aortic
root measuring 6 cm, and uncoiling and dilatation of the ascending,
arch, and descending aorta. There is no significant change. Distal
descending thoracic aorta measures about 4.5 cm. Aortic arch
measures approximately 3.9 cm. Ascending aorta measures
approximately 4.6 cm. There is extensive plaque and calcification.

Also as seen on the prior study, there is chronic bronchitic change
in the right lower lobe with bronchiectasis and parenchymal opacity
and distortion. This is unchanged. There is a small to moderate
right pleural effusion which has increased when compared to the
prior study. There is a tiny left effusion which is also slightly
increased.

There is diffuse esophageal wall thickening as previously described.
This is consistent with history of esophageal carcinoma. There is a
2 cm right thyroid nodule inferiorly, stable.

New from the prior study is filling defect in the pulmonary arterial
system. This is seen best on images 49 through 57, involving the
anterior aspect of the left upper lobe. There are no other emboli
identified.

Scans through the upper abdomen are unremarkable. There are no acute
osseous abnormalities. There is a subcutaneous mass overlying the
right scapula measuring about 4 cm. This is unchanged and likely
benign, with average attenuation value of 9 suggesting a cystic
lesion. There is a 2 mm pulmonary nodule left upper lobe series 6,
image number 27. There is a 4 mm pulmonary nodule series 6, image
number 68 laterally in the left mid lung zone. These are both
stable.

Review of the MIP images confirms the above findings.
IMPRESSION: Numerous stable chronic abnormalities as described above. There is
also acute pulmonary arterial embolism.

Critical Value/emergent results were called by telephone at the time
of interpretation on 08/15/2013 at [DATE] to the [HOSPITAL] emergency
physician, who verbally acknowledged these results.

## 2016-02-28 IMAGING — CT CT HEAD WITHOUT CONTRAST
1 of 3 series · 14 of 30 positions shown, 18 images · non-contrast
Comparison: CT scan of September 20, 2011.

CLINICAL DATA: Loss of consciousness after fall.

EXAM:
CT HEAD WITHOUT CONTRAST
TECHNIQUE: Contiguous axial images were obtained from the base of the skull
through the vertex without intravenous contrast.

[Series 2: head wo · axial · 0.47mm/px · z∈[-161,-35]mm · 14 of 34 slices shown, 18 images]
[im 3/34  brain]
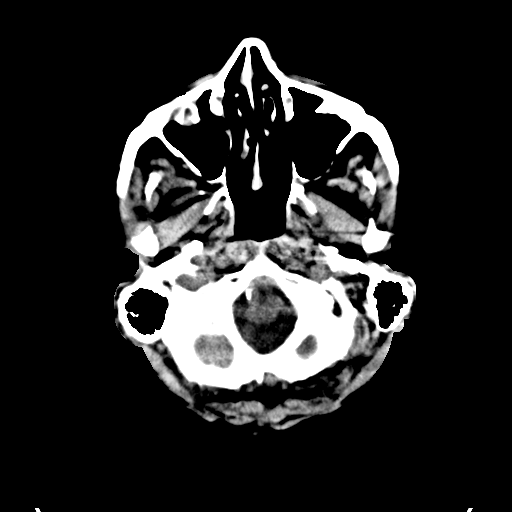
[im 3/34  bone]
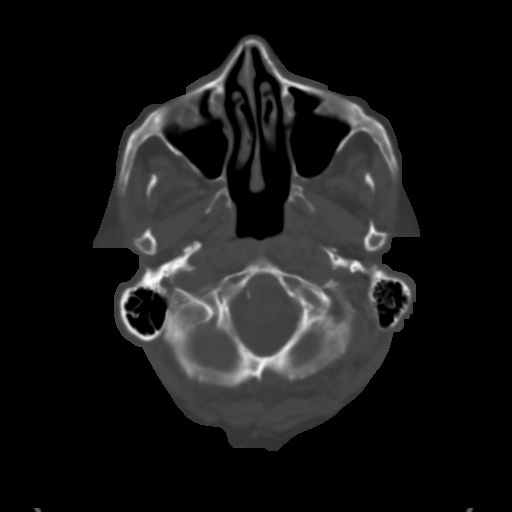
[im 5/34  brain]
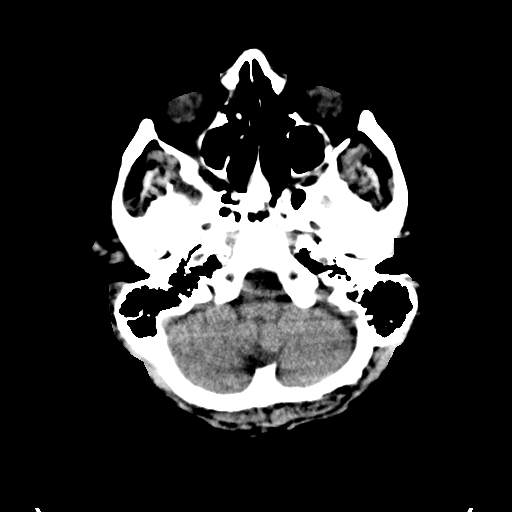
[im 7/34  brain]
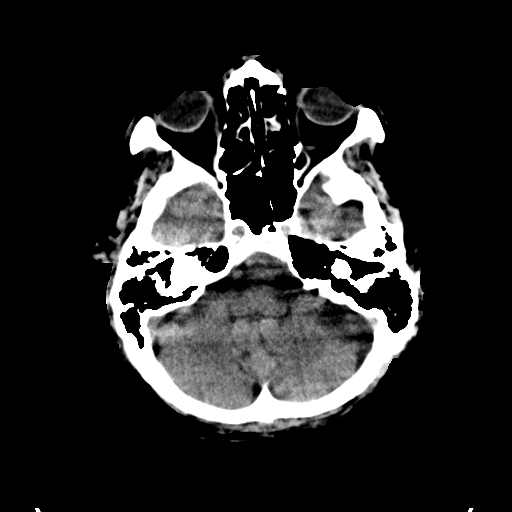
[im 9/34  brain]
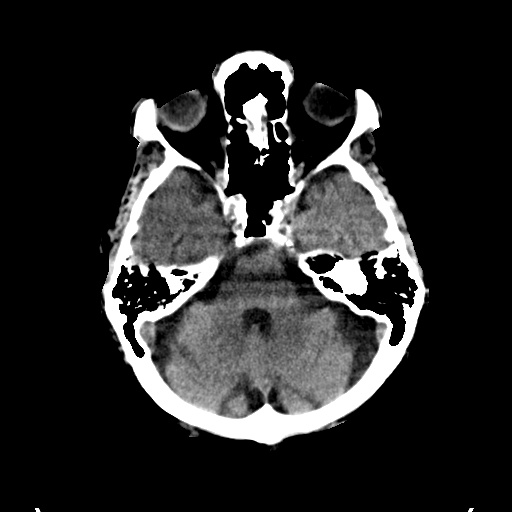
[im 12/34  brain]
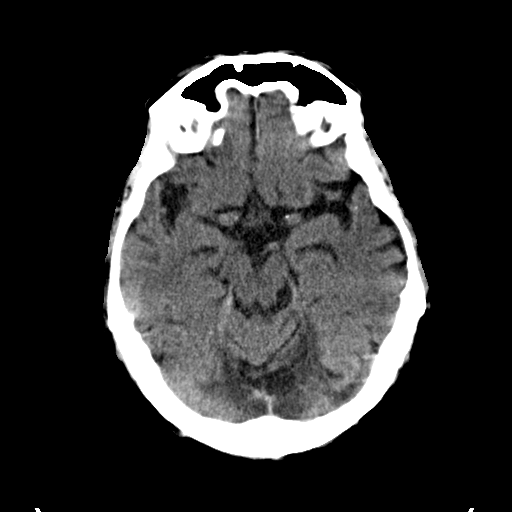
[im 12/34  bone]
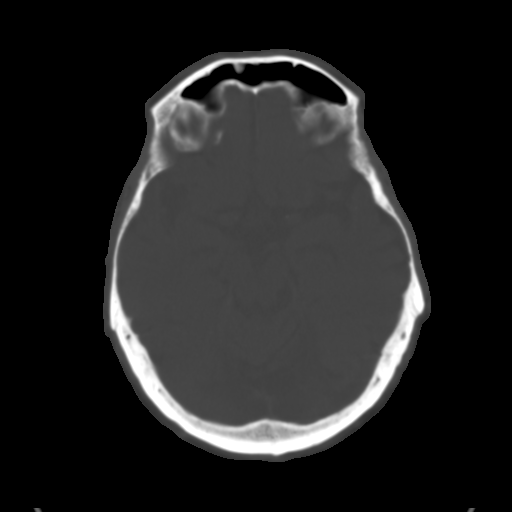
[im 14/34  brain]
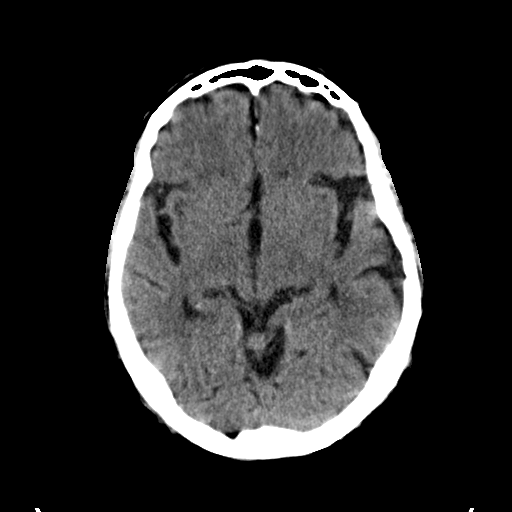
[im 16/34  brain]
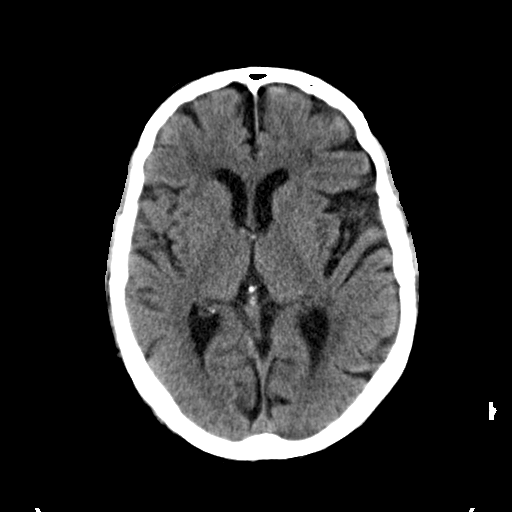
[im 18/34  brain]
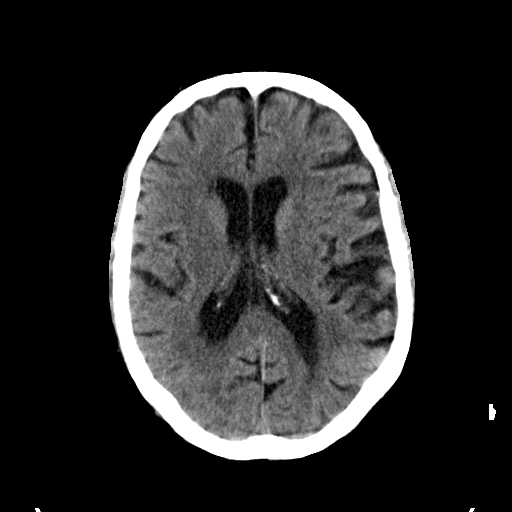
[im 20/34  brain]
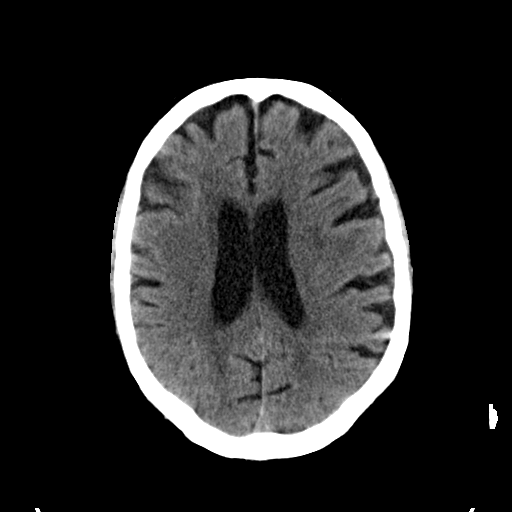
[im 20/34  bone]
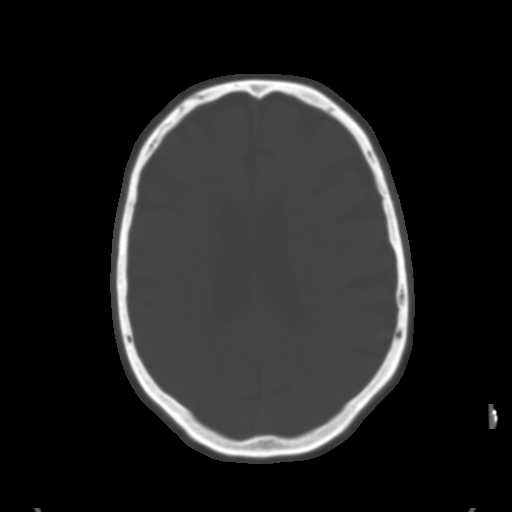
[im 23/34  brain]
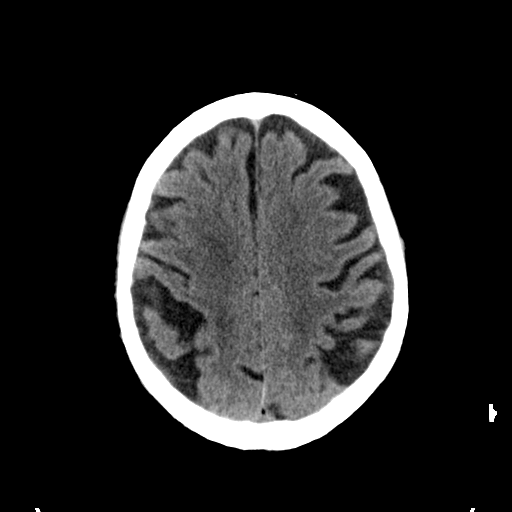
[im 25/34  brain]
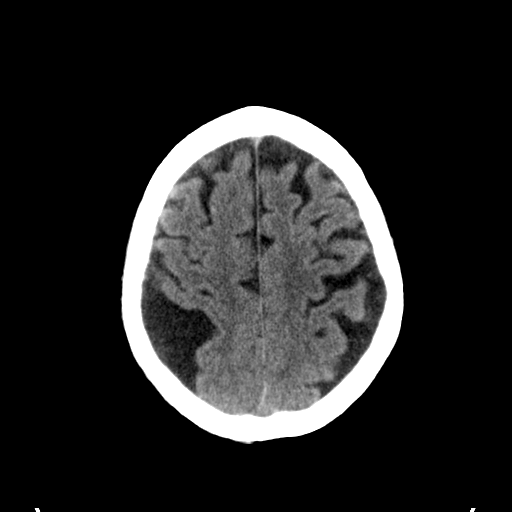
[im 27/34  brain]
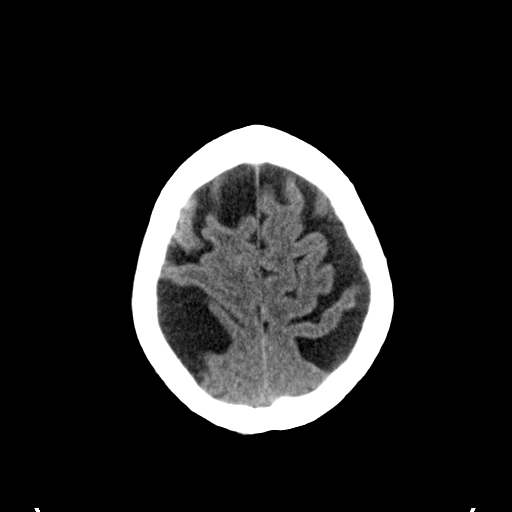
[im 29/34  brain]
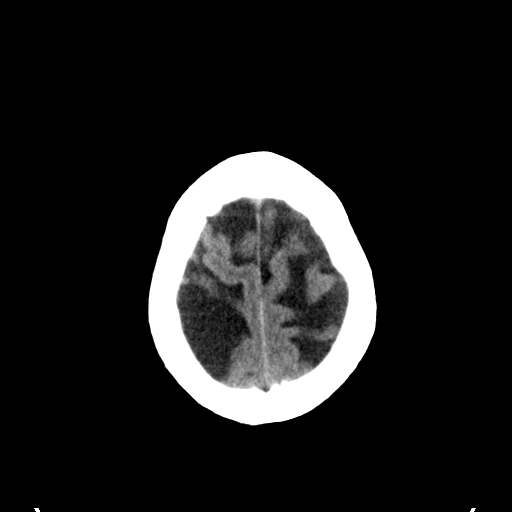
[im 29/34  bone]
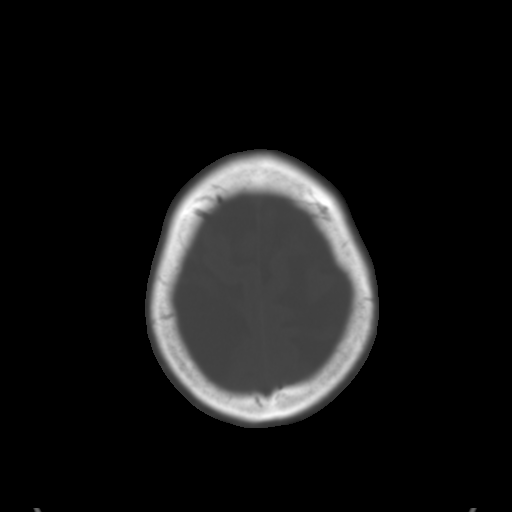
[im 31/34  brain]
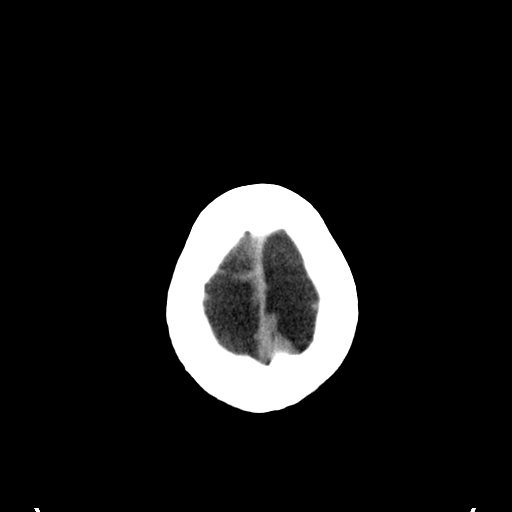

[14 of 30 positions shown; findings below may reference images not displayed]

FINDINGS: Bony calvarium appears intact. Moderate diffuse cortical atrophy is
noted. Mild chronic ischemic white matter disease is noted. No mass
effect or midline shift is noted. Ventricular size is within normal
limits. There is no evidence of mass lesion, hemorrhage or acute
infarction.
IMPRESSION: Moderate diffuse cortical atrophy. Mild chronic ischemic white
matter disease. No acute intracranial abnormality seen.

## 2016-02-29 IMAGING — US US EXTREM LOW VENOUS BILAT
1 series · 13 of 24 positions shown · non-contrast
Comparison: None.

CLINICAL DATA: Pulmonary embolism, history of malignancy and lower
extremity edema, evaluate for DVT



[Series 1: us extrem low venous bilat · 0.08mm/px · 13 of 63 slices shown]
[im 1/63]
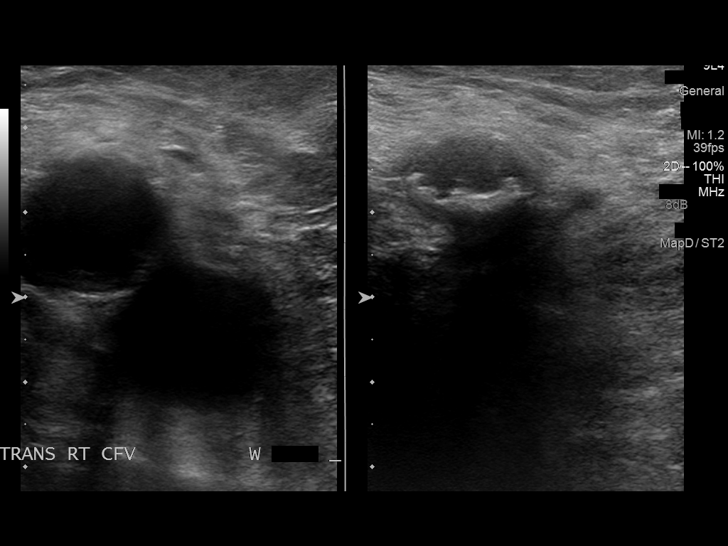
[im 6/63]
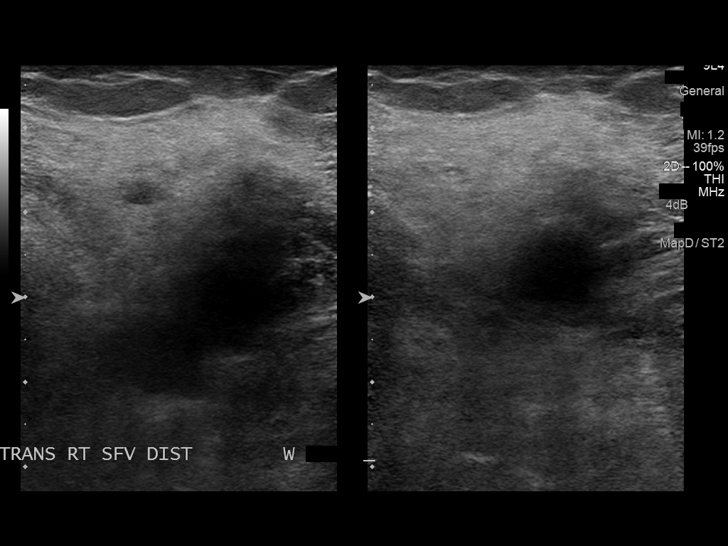
[im 11/63]
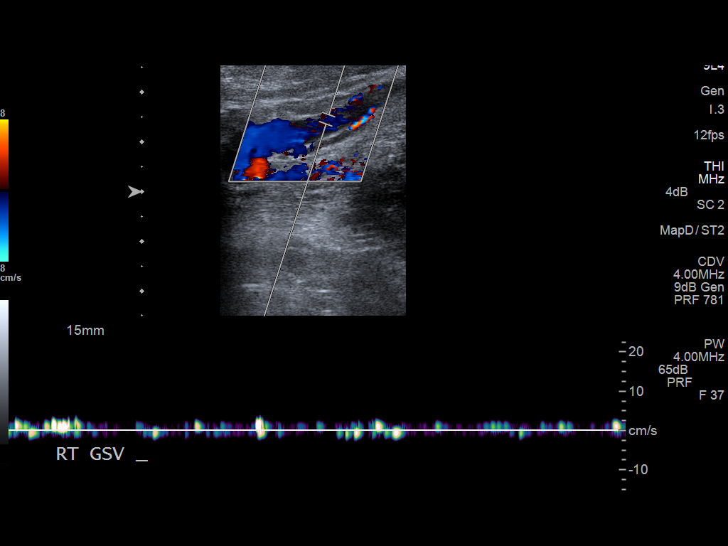
[im 17/63]
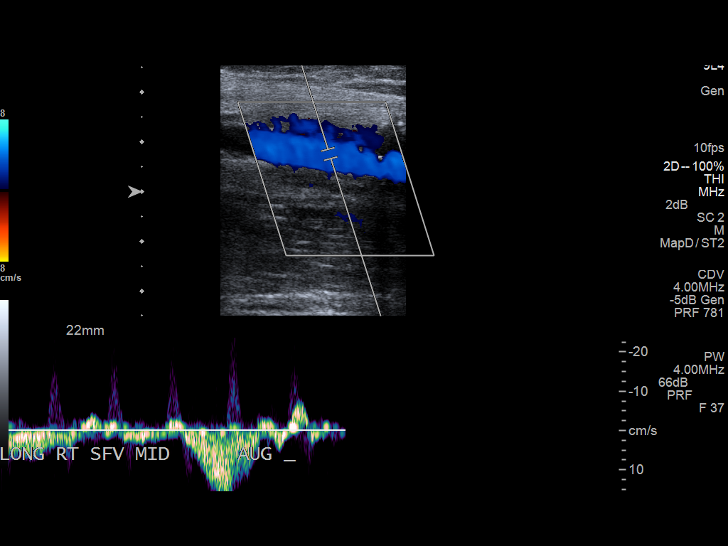
[im 22/63]
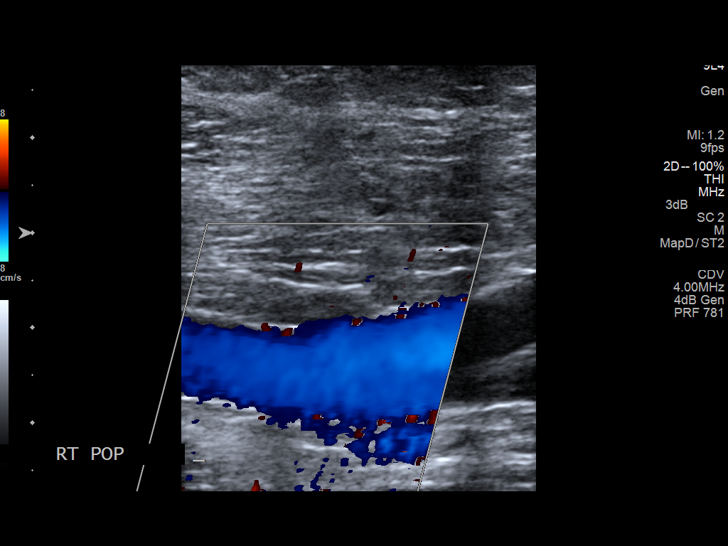
[im 27/63]
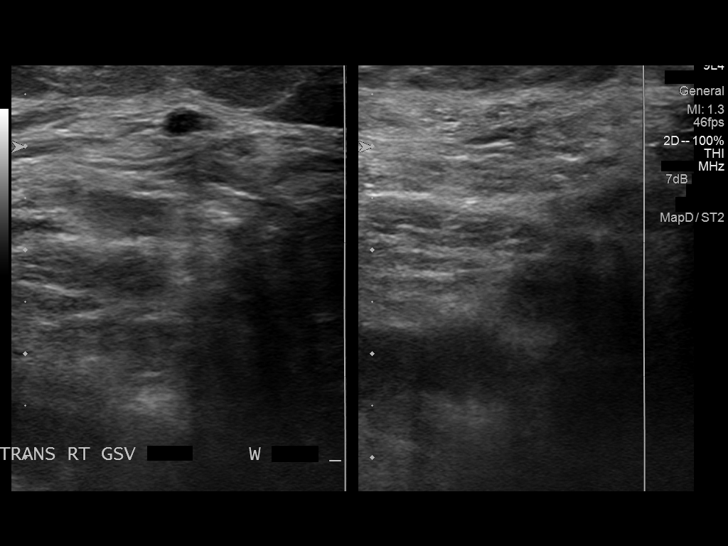
[im 33/63]
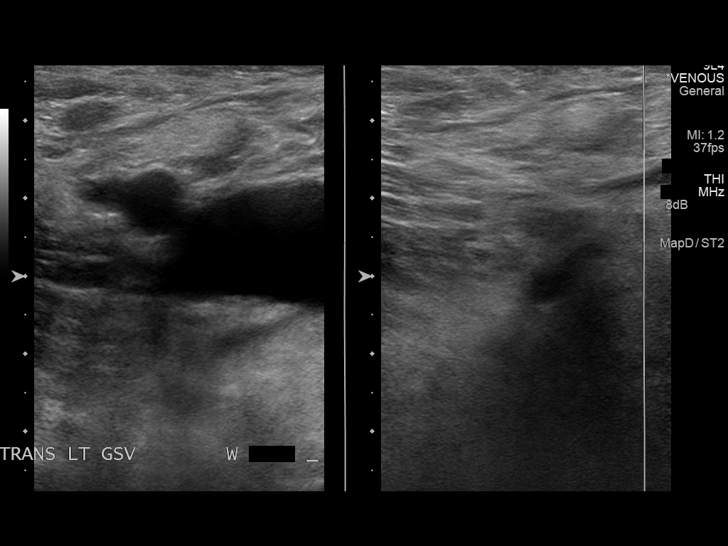
[im 36/63]
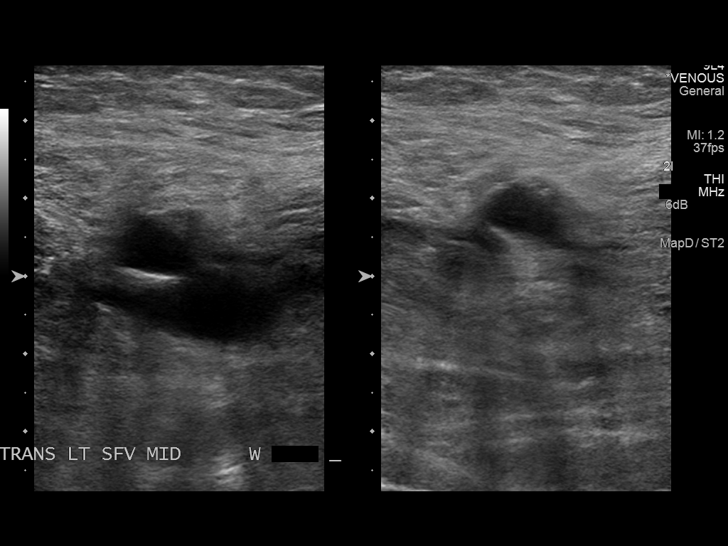
[im 41/63]
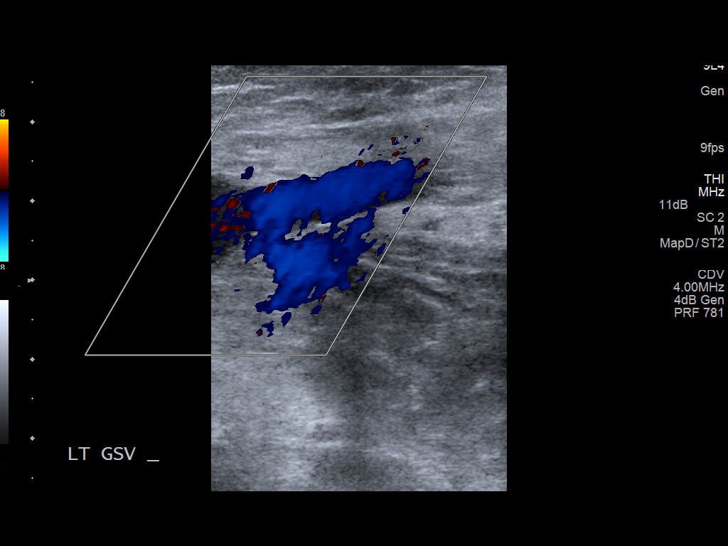
[im 46/63]
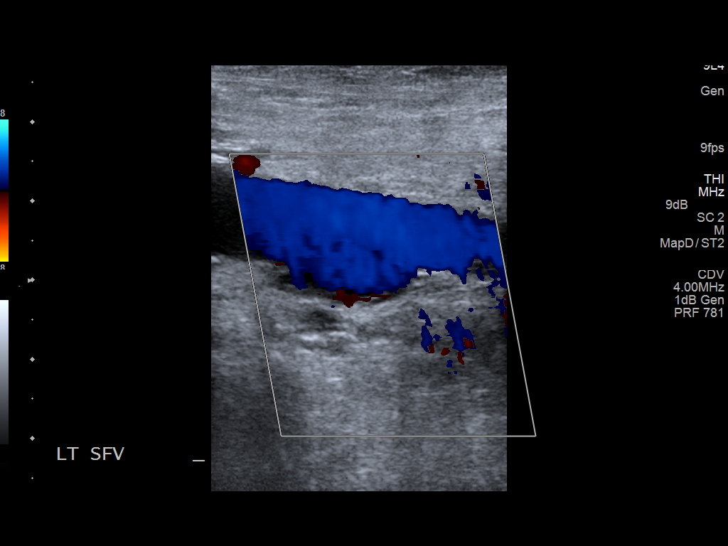
[im 52/63]
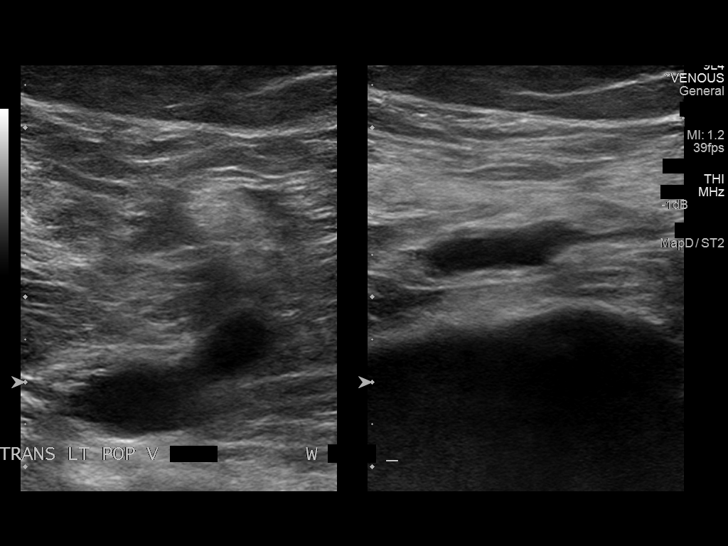
[im 57/63]
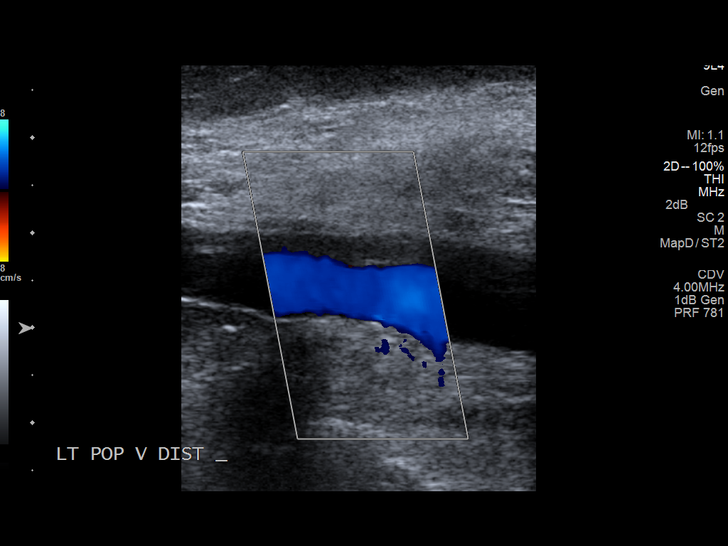
[im 63/63]
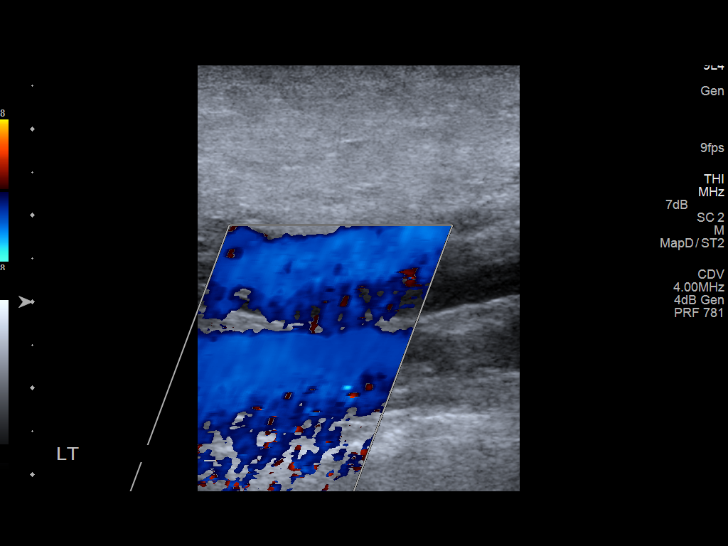

[13 of 24 positions shown; findings below may reference images not displayed]

FINDINGS: RIGHT LOWER EXTREMITY

Common Femoral Vein: No evidence of thrombus. Normal
compressibility, respiratory phasicity and response to augmentation.

Saphenofemoral Junction: No evidence of thrombus. Normal
compressibility and flow on color Doppler imaging.

Profunda Femoral Vein: No evidence of thrombus. Normal
compressibility and flow on color Doppler imaging.

Femoral Vein: No evidence of thrombus. Normal compressibility,
respiratory phasicity and response to augmentation.

Popliteal Vein: No evidence of thrombus. Normal compressibility,
respiratory phasicity and response to augmentation.

Calf Veins: No evidence of thrombus. Normal compressibility and flow
on color Doppler imaging.

Superficial Great Saphenous Vein: No evidence of thrombus. Normal
compressibility and flow on color Doppler imaging.

Venous Reflux:  None.

Other Findings:  None.

LEFT LOWER EXTREMITY

Common Femoral Vein: No evidence of thrombus. Normal
compressibility, respiratory phasicity and response to augmentation.

Saphenofemoral Junction: No evidence of thrombus. Normal
compressibility and flow on color Doppler imaging.

Profunda Femoral Vein: No evidence of thrombus. Normal
compressibility and flow on color Doppler imaging.

Femoral Vein: No evidence of thrombus. Normal compressibility,
respiratory phasicity and response to augmentation.

Popliteal Vein: No evidence of thrombus. Normal compressibility,
respiratory phasicity and response to augmentation.

Calf Veins: No evidence of thrombus. Normal compressibility and flow
on color Doppler imaging.

Superficial Great Saphenous Vein: No evidence of thrombus. Normal
compressibility and flow on color Doppler imaging.

Venous Reflux:  None.

Other Findings:  None.
IMPRESSION: No evidence of DVT within either lower extremity.
